# Patient Record
Sex: Female | Born: 1955 | Race: White | Hispanic: No | Marital: Married | Smoking: Never smoker
Health system: Southern US, Community
[De-identification: ages and names within clinical notes are randomized; demographics above are authoritative.]

## PROBLEM LIST (undated history)

## (undated) DIAGNOSIS — F419 Anxiety disorder, unspecified: Secondary | ICD-10-CM

## (undated) DIAGNOSIS — D649 Anemia, unspecified: Secondary | ICD-10-CM

## (undated) DIAGNOSIS — R112 Nausea with vomiting, unspecified: Secondary | ICD-10-CM

## (undated) DIAGNOSIS — R569 Unspecified convulsions: Secondary | ICD-10-CM

## (undated) DIAGNOSIS — M1990 Unspecified osteoarthritis, unspecified site: Secondary | ICD-10-CM

## (undated) DIAGNOSIS — Z9889 Other specified postprocedural states: Secondary | ICD-10-CM

---

## 1978-08-02 HISTORY — PX: OTHER SURGICAL HISTORY: SHX169

## 1999-03-21 ENCOUNTER — Emergency Department (HOSPITAL_COMMUNITY): Admission: EM | Admit: 1999-03-21 | Discharge: 1999-03-21 | Payer: Self-pay | Admitting: Emergency Medicine

## 1999-08-04 ENCOUNTER — Other Ambulatory Visit: Admission: RE | Admit: 1999-08-04 | Discharge: 1999-08-04 | Payer: Self-pay | Admitting: *Deleted

## 1999-08-27 ENCOUNTER — Ambulatory Visit (HOSPITAL_COMMUNITY): Admission: RE | Admit: 1999-08-27 | Discharge: 1999-08-27 | Payer: Self-pay | Admitting: *Deleted

## 2000-03-21 ENCOUNTER — Ambulatory Visit (HOSPITAL_COMMUNITY): Admission: RE | Admit: 2000-03-21 | Discharge: 2000-03-21 | Payer: Self-pay | Admitting: *Deleted

## 2000-03-21 ENCOUNTER — Encounter: Payer: Self-pay | Admitting: *Deleted

## 2000-03-31 ENCOUNTER — Ambulatory Visit (HOSPITAL_COMMUNITY): Admission: RE | Admit: 2000-03-31 | Discharge: 2000-03-31 | Payer: Self-pay | Admitting: *Deleted

## 2000-03-31 ENCOUNTER — Encounter: Payer: Self-pay | Admitting: *Deleted

## 2000-06-09 ENCOUNTER — Ambulatory Visit (HOSPITAL_COMMUNITY): Admission: RE | Admit: 2000-06-09 | Discharge: 2000-06-09 | Payer: Self-pay | Admitting: *Deleted

## 2000-06-09 ENCOUNTER — Encounter: Payer: Self-pay | Admitting: *Deleted

## 2001-02-21 ENCOUNTER — Other Ambulatory Visit: Admission: RE | Admit: 2001-02-21 | Discharge: 2001-02-21 | Payer: Self-pay | Admitting: Obstetrics and Gynecology

## 2002-01-23 ENCOUNTER — Ambulatory Visit: Admission: RE | Admit: 2002-01-23 | Discharge: 2002-01-23 | Payer: Self-pay | Admitting: Gastroenterology

## 2002-02-26 ENCOUNTER — Other Ambulatory Visit: Admission: RE | Admit: 2002-02-26 | Discharge: 2002-02-26 | Payer: Self-pay | Admitting: Obstetrics and Gynecology

## 2003-04-16 ENCOUNTER — Other Ambulatory Visit: Admission: RE | Admit: 2003-04-16 | Discharge: 2003-04-16 | Payer: Self-pay | Admitting: Obstetrics and Gynecology

## 2012-06-13 ENCOUNTER — Other Ambulatory Visit (HOSPITAL_COMMUNITY)
Admission: RE | Admit: 2012-06-13 | Discharge: 2012-06-13 | Disposition: A | Discharge status: Home / Self Care | Payer: Commercial Managed Care - PPO | Source: Ambulatory Visit | Attending: Family Medicine | Admitting: Family Medicine

## 2012-06-13 DIAGNOSIS — Z124 Encounter for screening for malignant neoplasm of cervix: Secondary | ICD-10-CM | POA: Insufficient documentation

## 2012-06-13 DIAGNOSIS — Z1151 Encounter for screening for human papillomavirus (HPV): Secondary | ICD-10-CM | POA: Insufficient documentation

## 2014-12-10 ENCOUNTER — Ambulatory Visit: Payer: Self-pay | Admitting: Orthopedic Surgery

## 2014-12-10 NOTE — Progress Notes (Signed)
Preoperative surgical orders have been place into the Epic hospital system for Rita Watson on 12/10/2014, 11:18 AM  by Patrica DuelPERKINS, ALEXZANDREW for surgery on 01-08-2015.  Preop Total Hip - Anterior Approach orders including IV Tylenol, and IV Decadron as long as there are no contraindications to the above medications. Rita Peacerew Perkins, PA-C

## 2015-01-02 NOTE — Progress Notes (Signed)
Pre Op Testing-12/31/14  Left message at [phone] to return my call 12/01/14 1000  Lm at above home number to call.  Five attempts  throughout the day to [phone] with phone number busy 01/02/15  ALL Correct phone numbers placed in epic

## 2015-01-03 NOTE — Patient Instructions (Addendum)
Michela Pitcheratricia K Reiley  01/03/2015   Your procedure is scheduled on:     01/08/2015    [facility] and follow signs to               Short Stay Center at      0845 AM.  Call this number if you have problems the morning of surgery [phone]   Remember: ONLY 1 PERSON MAY GO WITH YOU TO SHORT STAY TO GET  READY MORNING OF YOUR SURGERY.  Do not eat food or drink liquids :After Midnight.     Take these medicines the morning of surgery with A SIP OF WATER:    Dexilant, Prozac, Hydrocodone if needed                                You may not have any metal on your body including hair pins and              piercings  Do not wear jewelry, make-up, lotions, powders or perfumes, deodorant             Do not wear nail polish.  Do not shave  48 hours prior to surgery.                 Do not bring valuables to the hospital. [facility] IS NOT             RESPONSIBLE   FOR VALUABLES.  Contacts, dentures or bridgework may not be worn into surgery.  Leave suitcase in the car. After surgery it may be brought to your room.       Special Instructions: coughing and deep breathing exercises, leg exercises             Please read over the following fact sheets you were given: _____________________________________________________________________             [facility] - Preparing for Surgery Before surgery, you can play an important role.  Because skin is not sterile, your skin needs to be as free of germs as possible.  You can reduce the number of germs on your skin by washing with CHG (chlorahexidine gluconate) soap before surgery.  CHG is an antiseptic cleaner which kills germs and bonds with the skin to continue killing germs even after washing. Please DO NOT use if you have an allergy to CHG or antibacterial soaps.  If your skin becomes reddened/irritated stop using the CHG and inform your nurse when you arrive at Short Stay. Do not shave (including legs  and underarms) for at least 48 hours prior to the first CHG shower.  You may shave your face/neck. Please follow these instructions carefully:  1.  Shower with CHG Soap the night before surgery and the  morning of Surgery.  2.  If you choose to wash your hair, wash your hair first as usual with your  normal  shampoo.  3.  After you shampoo, rinse your hair and body thoroughly to remove the  shampoo.                           4.  Use CHG as you would any other liquid soap.  You can apply chg directly  to the skin and wash  Gently with a scrungie or clean washcloth.  5.  Apply the CHG Soap to your body ONLY FROM THE NECK DOWN.   Do not use on face/ open                           Wound or open sores. Avoid contact with eyes, ears mouth and genitals (private parts).                       Wash face,  Genitals (private parts) with your normal soap.             6.  Wash thoroughly, paying special attention to the area where your surgery  will be performed.  7.  Thoroughly rinse your body with warm water from the neck down.  8.  DO NOT shower/wash with your normal soap after using and rinsing off  the CHG Soap.                9.  Pat yourself dry with a clean towel.            10.  Wear clean pajamas.            11.  Place clean sheets on your bed the night of your first shower and do not  sleep with pets. Day of Surgery : Do not apply any lotions/deodorants the morning of surgery.  Please wear clean clothes to the hospital/surgery center.  FAILURE TO FOLLOW THESE INSTRUCTIONS MAY RESULT IN THE CANCELLATION OF YOUR SURGERY PATIENT SIGNATURE_________________________________  NURSE SIGNATURE__________________________________  ________________________________________________________________________  WHAT IS A BLOOD TRANSFUSION? Blood Transfusion Information  A transfusion is the replacement of blood or some of its parts. Blood is made up of multiple cells which provide different  functions.  Red blood cells carry oxygen and are used for blood loss replacement.  White blood cells fight against infection.  Platelets control bleeding.  Plasma helps clot blood.  Other blood products are available for specialized needs, such as hemophilia or other clotting disorders. BEFORE THE TRANSFUSION  Who gives blood for transfusions?   Healthy volunteers who are fully evaluated to make sure their blood is safe. This is blood bank blood. Transfusion therapy is the safest it has ever been in the practice of medicine. Before blood is taken from a donor, a complete history is taken to make sure that person has no history of diseases nor engages in risky social behavior (examples are intravenous drug use or sexual activity with multiple partners). The donor's travel history is screened to minimize risk of transmitting infections, such as malaria. The donated blood is tested for signs of infectious diseases, such as HIV and hepatitis. The blood is then tested to be sure it is compatible with you in order to minimize the chance of a transfusion reaction. If you or a relative donates blood, this is often done in anticipation of surgery and is not appropriate for emergency situations. It takes many days to process the donated blood. RISKS AND COMPLICATIONS Although transfusion therapy is very safe and saves many lives, the main dangers of transfusion include:  1. Getting an infectious disease. 2. Developing a transfusion reaction. This is an allergic reaction to something in the blood you were given. Every precaution is taken to prevent this. The decision to have a blood transfusion has been considered carefully by your caregiver before blood is given. Blood is not given unless the benefits outweigh  the risks. AFTER THE TRANSFUSION  Right after receiving a blood transfusion, you will usually feel much better and more energetic. This is especially true if your red blood cells have gotten low  (anemic). The transfusion raises the level of the red blood cells which carry oxygen, and this usually causes an energy increase.  The nurse administering the transfusion will monitor you carefully for complications. HOME CARE INSTRUCTIONS  No special instructions are needed after a transfusion. You may find your energy is better. Speak with your caregiver about any limitations on activity for underlying diseases you may have. SEEK MEDICAL CARE IF:   Your condition is not improving after your transfusion.  You develop redness or irritation at the intravenous (IV) site. SEEK IMMEDIATE MEDICAL CARE IF:  Any of the following symptoms occur over the next 12 hours:  Shaking chills.  You have a temperature by mouth above 102 F (38.9 C), not controlled by medicine.  Chest, back, or muscle pain.  People around you feel you are not acting correctly or are confused.  Shortness of breath or difficulty breathing.  Dizziness and fainting.  You get a rash or develop hives.  You have a decrease in urine output.  Your urine turns a dark color or changes to pink, red, or brown. Any of the following symptoms occur over the next 10 days:  You have a temperature by mouth above 102 F (38.9 C), not controlled by medicine.  Shortness of breath.  Weakness after normal activity.  The white part of the eye turns yellow (jaundice).  You have a decrease in the amount of urine or are urinating less often.  Your urine turns a dark color or changes to pink, red, or brown. Document Released: 07/16/2000 Document Revised: 10/11/2011 Document Reviewed: 03/04/2008 ExitCare Patient Information 2014 [place].  _______________________________________________________________________  Incentive Spirometer  An incentive spirometer is a tool that can help keep your lungs clear and active. This tool measures how well you are filling your lungs with each breath. Taking long deep breaths may help  reverse or decrease the chance of developing breathing (pulmonary) problems (especially infection) following:  A long period of time when you are unable to move or be active. BEFORE THE PROCEDURE   If the spirometer includes an indicator to show your best effort, your nurse or respiratory therapist will set it to a desired goal.  If possible, sit up straight or lean slightly forward. Try not to slouch.  Hold the incentive spirometer in an upright position. INSTRUCTIONS FOR USE  3. Sit on the edge of your bed if possible, or sit up as far as you can in bed or on a chair. 4. Hold the incentive spirometer in an upright position. 5. Breathe out normally. 6. Place the mouthpiece in your mouth and seal your lips tightly around it. 7. Breathe in slowly and as deeply as possible, raising the piston or the ball toward the top of the column. 8. Hold your breath for 3-5 seconds or for as long as possible. Allow the piston or ball to fall to the bottom of the column. 9. Remove the mouthpiece from your mouth and breathe out normally. 10. Rest for a few seconds and repeat Steps 1 through 7 at least 10 times every 1-2 hours when you are awake. Take your time and take a few normal breaths between deep breaths. 11. The spirometer may include an indicator to show your best effort. Use the indicator as a goal to work  toward during each repetition. 12. After each set of 10 deep breaths, practice coughing to be sure your lungs are clear. If you have an incision (the cut made at the time of surgery), support your incision when coughing by placing a pillow or rolled up towels firmly against it. Once you are able to get out of bed, walk around indoors and cough well. You may stop using the incentive spirometer when instructed by your caregiver.  RISKS AND COMPLICATIONS  Take your time so you do not get dizzy or light-headed.  If you are in pain, you may need to take or ask for pain medication before doing  incentive spirometry. It is harder to take a deep breath if you are having pain. AFTER USE  Rest and breathe slowly and easily.  It can be helpful to keep track of a log of your progress. Your caregiver can provide you with a simple table to help with this. If you are using the spirometer at home, follow these instructions: [place] IF:   You are having difficultly using the spirometer.  You have trouble using the spirometer as often as instructed.  Your pain medication is not giving enough relief while using the spirometer.  You develop fever of 100.5 F (38.1 C) or higher. SEEK IMMEDIATE MEDICAL CARE IF:   You cough up bloody sputum that had not been present before.  You develop fever of 102 F (38.9 C) or greater.  You develop worsening pain at or near the incision site. MAKE SURE YOU:   Understand these instructions.  Will watch your condition.  Will get help right away if you are not doing well or get worse. Document Released: 11/29/2006 Document Revised: 10/11/2011 Document Reviewed: 01/30/2007 [facility] Patient Information 2014 [place], [place].   ________________________________________________________________________

## 2015-01-06 ENCOUNTER — Encounter (HOSPITAL_COMMUNITY)
Admission: RE | Admit: 2015-01-06 | Discharge: 2015-01-06 | Disposition: A | Discharge status: Home / Self Care | Payer: BLUE CROSS/BLUE SHIELD | Source: Ambulatory Visit | Attending: Orthopedic Surgery | Admitting: Orthopedic Surgery

## 2015-01-06 ENCOUNTER — Encounter (HOSPITAL_COMMUNITY): Payer: Self-pay

## 2015-01-06 HISTORY — DX: Anemia, unspecified: D64.9

## 2015-01-06 HISTORY — DX: Other specified postprocedural states: Z98.890

## 2015-01-06 HISTORY — DX: Anxiety disorder, unspecified: F41.9

## 2015-01-06 HISTORY — DX: Unspecified osteoarthritis, unspecified site: M19.90

## 2015-01-06 HISTORY — DX: Unspecified convulsions: R56.9

## 2015-01-06 HISTORY — DX: Nausea with vomiting, unspecified: R11.2

## 2015-01-06 LAB — COMPREHENSIVE METABOLIC PANEL
ALT: 20 U/L (ref 14–54)
ANION GAP: 7 (ref 5–15)
AST: 25 U/L (ref 15–41)
Albumin: 3.8 g/dL (ref 3.5–5.0)
Alkaline Phosphatase: 70 U/L (ref 38–126)
BUN: 15 mg/dL (ref 6–20)
CALCIUM: 9 mg/dL (ref 8.9–10.3)
CO2: 26 mmol/L (ref 22–32)
Chloride: 106 mmol/L (ref 101–111)
Creatinine, Ser: 0.57 mg/dL (ref 0.44–1.00)
Glucose, Bld: 95 mg/dL (ref 65–99)
Potassium: 4.2 mmol/L (ref 3.5–5.1)
Sodium: 139 mmol/L (ref 135–145)
Total Bilirubin: 0.2 mg/dL — ABNORMAL LOW (ref 0.3–1.2)
Total Protein: 6.8 g/dL (ref 6.5–8.1)

## 2015-01-06 LAB — PROTIME-INR
INR: 1.13 (ref 0.00–1.49)
Prothrombin Time: 14.7 seconds (ref 11.6–15.2)

## 2015-01-06 LAB — URINALYSIS, ROUTINE W REFLEX MICROSCOPIC
BILIRUBIN URINE: NEGATIVE
GLUCOSE, UA: NEGATIVE mg/dL
Hgb urine dipstick: NEGATIVE
Ketones, ur: NEGATIVE mg/dL
LEUKOCYTES UA: NEGATIVE
Nitrite: NEGATIVE
PROTEIN: NEGATIVE mg/dL
Specific Gravity, Urine: 1.006 (ref 1.005–1.030)
Urobilinogen, UA: 0.2 mg/dL (ref 0.0–1.0)
pH: 6.5 (ref 5.0–8.0)

## 2015-01-06 LAB — CBC
HCT: 34.3 % — ABNORMAL LOW (ref 36.0–46.0)
Hemoglobin: 11 g/dL — ABNORMAL LOW (ref 12.0–15.0)
MCH: 25.5 pg — ABNORMAL LOW (ref 26.0–34.0)
MCHC: 32.1 g/dL (ref 30.0–36.0)
MCV: 79.6 fL (ref 78.0–100.0)
PLATELETS: 330 10*3/uL (ref 150–400)
RBC: 4.31 MIL/uL (ref 3.87–5.11)
RDW: 15.9 % — ABNORMAL HIGH (ref 11.5–15.5)
WBC: 8.6 10*3/uL (ref 4.0–10.5)

## 2015-01-06 LAB — SURGICAL PCR SCREEN
MRSA, PCR: NEGATIVE
STAPHYLOCOCCUS AUREUS: NEGATIVE

## 2015-01-06 LAB — APTT: aPTT: 32 seconds (ref 24–37)

## 2015-01-06 LAB — ABO/RH: ABO/RH(D): B NEG

## 2015-01-06 NOTE — Progress Notes (Signed)
Called office of Dr Loreta AveMann and requested LOV note and procedure note from 12/2014.  They will fax.

## 2015-01-06 NOTE — Progress Notes (Signed)
LOV note- 12/26/2014- Dr Charna ElizabethJyothi Mann on chart  And old office visit note from 12/2006  On chart  EGD done 01/03/2015 on chart along with colonoscopy done 01/03/2015 on chart

## 2015-01-06 NOTE — Progress Notes (Signed)
EKG- 12/03/2014 on chart  LOV- 12/03/14- DR Mila PalmerSharon Wolters on chart .

## 2015-01-07 ENCOUNTER — Ambulatory Visit: Payer: Self-pay | Admitting: Orthopedic Surgery

## 2015-01-07 NOTE — H&P (Signed)
Rita Watson DOB: 10/20/1955 Married / Language: English / Race: White Female Date of Admission:  01/08/2015 CC:  Left Hip Pain History of Present Illness The patient is a 59 year old female who comes in for a preoperative History and Physical. The patient is scheduled for a left total hip arthroplasty (anterior) to be performed by Dr. Gus Rankin. Aluisio, MD at [facility] on 01-08-2015. The patient is a 59 year old female who presents for their left hip pain and osteoarthritis. Symptoms reported today include: pain (that does seem to be getting progressively worse over time). Current treatment includes: NSAIDs (Aleve). She states the hip has gotten progressively worse since we last saw her four and half months ago. It is hurting at all times. It is limiting what she can and cannot do. She would like to be more active, but the hip is currently preventing her from doing so. She has lost more mobility since last visit. She even has pain at night at times. She is ready to proceed with surgery.  They have been treated conservatively in the past for the above stated problem and despite conservative measures, they continue to have progressive pain and severe functional limitations and dysfunction. They have failed non-operative management including home exercise, medications. It is felt that they would benefit from undergoing total joint replacement. Risks and benefits of the procedure have been discussed with the patient and they elect to proceed with surgery. There are no active contraindications to surgery such as ongoing infection or rapidly progressive neurological disease.  Problem List/Past Medical Primary osteoarthritis of left hip (M16.12) Osteoarthritis Seizure Disorder last episode about 25-30 years ago Ulcerative Colitis Menopause Allergic Rhinitis  Allergies Sulfa Drugs Nausea. GI upset  Family History Chronic Obstructive Lung Disease Paternal  Grandmother. Depression Mother. Heart Disease Father, Maternal Grandfather, Maternal Grandmother, Paternal Grandfather. Cancer Mother. Hypertension Brother, Father. Osteoarthritis Mother. Rheumatoid Arthritis Mother.  Social History Number of flights of stairs before winded 2-3 Tobacco use Never smoker. 04/30/2014 Tobacco / smoke exposure 04/30/2014: no No history of drug/alcohol rehab Not under pain contract Living situation live with spouse Exercise Exercises weekly; does running / walking Current work status working full time Marital status married Current drinker 04/30/2014: Currently drinks wine only occasionally per week Children 1  Medication History Aleve (Oral) Specific dose unknown - Active. Depakote (Oral) Specific dose unknown - Active. FLUoxetine HCl (  Capsule, Oral) Active. Multiple Vitamins (Oral) Active.  Past Surgical History Arthroscopy of Knee left Colon Polyp Removal - Colonoscopy Dilation and Curettage of Uterus Liver Laceration Repair Date: 1991  Review of Systems General Not Present- Chills, Fatigue, Fever, Memory Loss, Night Sweats, Weight Gain and Weight Loss. Skin Not Present- Eczema, Hives, Itching, Lesions and Rash. HEENT Not Present- Dentures, Double Vision, Headache, Hearing Loss, Tinnitus and Visual Loss. Respiratory Not Present- Allergies, Chronic Cough, Coughing up blood, Shortness of breath at rest and Shortness of breath with exertion. Cardiovascular Not Present- Chest Pain, Difficulty Breathing Lying Down, Murmur, Palpitations, Racing/skipping heartbeats and Swelling. Gastrointestinal Not Present- Abdominal Pain, Bloody Stool, Constipation, Diarrhea, Difficulty Swallowing, Heartburn, Jaundice, Loss of appetitie, Nausea and Vomiting. Female Genitourinary Not Present- Blood in Urine, Discharge, Flank Pain, Incontinence, Painful Urination, Urgency, Urinary frequency, Urinary Retention, Urinating at Night and Weak  urinary stream. Musculoskeletal Present- Joint Pain. Not Present- Back Pain, Joint Swelling, Morning Stiffness, Muscle Pain, Muscle Weakness and Spasms. Neurological Not Present- Blackout spells, Difficulty with balance, Dizziness, Paralysis, Tremor and Weakness. Psychiatric Not Present- Insomnia.  Vitals  Weight: 140 lb Height: 66in Weight was reported by patient. Height was reported by patient. Body Surface Area: 1.72 m Body Mass Index: 22.6 kg/m  BP: 122/72 (Sitting, Left Arm, Standard)  Physical Exam General Mental Status -Alert, cooperative and good historian. General Appearance-pleasant, Not in acute distress. Orientation-Oriented X3. Build & Nutrition-Well nourished and Well developed.  Head and Neck Head-normocephalic, atraumatic . Neck Global Assessment - supple, no bruit auscultated on the right, no bruit auscultated on the left.  Eye Pupil - Bilateral-Regular and Round. Motion - Bilateral-EOMI.  Chest and Lung Exam Auscultation Breath sounds - clear at anterior chest wall and clear at posterior chest wall. Adventitious sounds - No Adventitious sounds.  Cardiovascular Auscultation Rhythm - Regular rate and rhythm. Heart Sounds - S1 WNL and S2 WNL. Murmurs & Other Heart Sounds - Auscultation of the heart reveals - No Murmurs.  Abdomen Palpation/Percussion Tenderness - Abdomen is non-tender to palpation. Rigidity (guarding) - Abdomen is soft. Auscultation Auscultation of the abdomen reveals - Bowel sounds normal.  Female Genitourinary Note: Not done, not pertinent to present illness   Musculoskeletal Note: On exam, she is alert and oriented and in no apparent distress. Her left hip shows flexion to 90, no internal rotation, about 10 to 20 of external rotation, and 20 of abduction. The right hip has normal range of motion. Gait pattern is antalgic.  RADIOGRAPHS: AP pelvis and lateral of the hip show that she has severe bone on bone  arthritis of the hip with subchondral cystic formation and a large lateral osteophyte. 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  Assessment & Plan  Primary osteoarthritis of left hip (M16.12) Note:Surgical Plans: Left Total Hip Replacement - Anterior Approach  Disposition: Home  PCP: Dr. Mila PalmerSharon Wolters - Patient has been seen preoperatively and felt to be stable for surgery. She did want the patient "to see her GI/Dr. Loreta AveMann prior to surgery." "Please ask patient to call her GI/Dr. Loreta AveMann for a follow-up of possible GI loss. STOP NSAIDs,"  GI: Dr. Loreta AveMann - Patient has been seen preoperatively and felt to be stable for surgery.  IV TXA  Anesthesia Issues: History of Postop Nausea  Signed electronically by Lauraine RinneAlexzandrew L Oluwanifemi Susman, III PA-C

## 2015-01-08 ENCOUNTER — Encounter (HOSPITAL_COMMUNITY): Admission: RE | Payer: Self-pay | Source: Ambulatory Visit

## 2015-01-08 ENCOUNTER — Inpatient Hospital Stay (HOSPITAL_COMMUNITY): Payer: BLUE CROSS/BLUE SHIELD | Admitting: Anesthesiology

## 2015-01-08 ENCOUNTER — Inpatient Hospital Stay (HOSPITAL_COMMUNITY): Payer: BLUE CROSS/BLUE SHIELD

## 2015-01-08 ENCOUNTER — Inpatient Hospital Stay (HOSPITAL_COMMUNITY)
Admission: RE | Admit: 2015-01-08 | Discharge: 2015-01-09 | DRG: 470 | Disposition: A | Discharge status: Home Health | Payer: BLUE CROSS/BLUE SHIELD | Source: Ambulatory Visit | Attending: Orthopedic Surgery | Admitting: Orthopedic Surgery

## 2015-01-08 ENCOUNTER — Inpatient Hospital Stay (HOSPITAL_COMMUNITY)
Admission: RE | Admit: 2015-01-08 | Payer: Commercial Managed Care - PPO | Source: Ambulatory Visit | Admitting: Orthopedic Surgery

## 2015-01-08 ENCOUNTER — Encounter (HOSPITAL_COMMUNITY): Payer: Self-pay | Admitting: *Deleted

## 2015-01-08 ENCOUNTER — Encounter (HOSPITAL_COMMUNITY)
Admission: RE | Disposition: A | Discharge status: Home Health | Payer: Self-pay | Source: Ambulatory Visit | Attending: Orthopedic Surgery

## 2015-01-08 DIAGNOSIS — Z01812 Encounter for preprocedural laboratory examination: Secondary | ICD-10-CM | POA: Diagnosis not present

## 2015-01-08 DIAGNOSIS — M169 Osteoarthritis of hip, unspecified: Secondary | ICD-10-CM | POA: Diagnosis present

## 2015-01-08 DIAGNOSIS — Z79899 Other long term (current) drug therapy: Secondary | ICD-10-CM

## 2015-01-08 DIAGNOSIS — M25552 Pain in left hip: Secondary | ICD-10-CM | POA: Diagnosis present

## 2015-01-08 DIAGNOSIS — M1612 Unilateral primary osteoarthritis, left hip: Secondary | ICD-10-CM

## 2015-01-08 DIAGNOSIS — G40909 Epilepsy, unspecified, not intractable, without status epilepticus: Secondary | ICD-10-CM | POA: Diagnosis present

## 2015-01-08 DIAGNOSIS — Z96649 Presence of unspecified artificial hip joint: Secondary | ICD-10-CM

## 2015-01-08 HISTORY — PX: TOTAL HIP ARTHROPLASTY: SHX124

## 2015-01-08 LAB — TYPE AND SCREEN
ABO/RH(D): B NEG
Antibody Screen: NEGATIVE

## 2015-01-08 SURGERY — ARTHROPLASTY, HIP, TOTAL, ANTERIOR APPROACH
Anesthesia: Spinal | Site: Hip | Laterality: Left

## 2015-01-08 SURGERY — ARTHROPLASTY, HIP, TOTAL, ANTERIOR APPROACH
Anesthesia: Choice | Site: Hip | Laterality: Left

## 2015-01-08 MED ORDER — ONDANSETRON HCL 4 MG/2ML IJ SOLN
INTRAMUSCULAR | Status: AC
  Filled 2015-01-08: qty 2

## 2015-01-08 MED ORDER — PHENOL 1.4 % MT LIQD: 1.0000 | OROMUCOSAL | Status: DC | PRN

## 2015-01-08 MED ORDER — PANTOPRAZOLE SODIUM 40 MG PO TBEC
80.0000 mg | DELAYED_RELEASE_TABLET | Freq: Every day | ORAL | Status: DC
  Administered 2015-01-08 – 2015-01-09 (×2): 80 mg via ORAL
  Filled 2015-01-08 (×2): qty 2

## 2015-01-08 MED ORDER — PHENYLEPHRINE 40 MCG/ML (10ML) SYRINGE FOR IV PUSH (FOR BLOOD PRESSURE SUPPORT)
PREFILLED_SYRINGE | INTRAVENOUS | Status: AC
  Filled 2015-01-08: qty 10

## 2015-01-08 MED ORDER — CEFAZOLIN SODIUM-DEXTROSE 2-3 GM-% IV SOLR
INTRAVENOUS | Status: AC
Start: 2015-01-08 — End: 2015-01-08
  Filled 2015-01-08: qty 50

## 2015-01-08 MED ORDER — FLEET ENEMA 7-19 GM/118ML RE ENEM: 1.0000 | ENEMA | Freq: Once | RECTAL | Status: AC | PRN

## 2015-01-08 MED ORDER — PROPOFOL INFUSION 10 MG/ML OPTIME
INTRAVENOUS | Status: DC | PRN
  Administered 2015-01-08: 120 ug/kg/min via INTRAVENOUS

## 2015-01-08 MED ORDER — LACTATED RINGERS IV SOLN: INTRAVENOUS | Status: DC

## 2015-01-08 MED ORDER — DEXAMETHASONE SODIUM PHOSPHATE 10 MG/ML IJ SOLN
10.0000 mg | Freq: Once | INTRAMUSCULAR | Status: AC
  Administered 2015-01-08: 10 mg via INTRAVENOUS

## 2015-01-08 MED ORDER — METOCLOPRAMIDE HCL 5 MG/ML IJ SOLN
5.0000 mg | Freq: Three times a day (TID) | INTRAMUSCULAR | Status: DC | PRN
Start: 2015-01-08 — End: 2015-01-09

## 2015-01-08 MED ORDER — ONDANSETRON HCL 4 MG/2ML IJ SOLN: 4.0000 mg | Freq: Four times a day (QID) | INTRAMUSCULAR | Status: DC | PRN

## 2015-01-08 MED ORDER — FENTANYL CITRATE (PF) 100 MCG/2ML IJ SOLN
INTRAMUSCULAR | Status: AC
  Filled 2015-01-08: qty 2

## 2015-01-08 MED ORDER — METHOCARBAMOL 500 MG PO TABS
500.0000 mg | ORAL_TABLET | Freq: Four times a day (QID) | ORAL | Status: DC | PRN
  Administered 2015-01-08 – 2015-01-09 (×2): 500 mg via ORAL
  Filled 2015-01-08 (×2): qty 1

## 2015-01-08 MED ORDER — DIPHENHYDRAMINE HCL 12.5 MG/5ML PO ELIX
12.5000 mg | ORAL_SOLUTION | ORAL | Status: DC | PRN
Start: 2015-01-08 — End: 2015-01-09

## 2015-01-08 MED ORDER — ONDANSETRON HCL 4 MG PO TABS: 4.0000 mg | ORAL_TABLET | Freq: Four times a day (QID) | ORAL | Status: DC | PRN

## 2015-01-08 MED ORDER — ACETAMINOPHEN 325 MG PO TABS
650.0000 mg | ORAL_TABLET | Freq: Four times a day (QID) | ORAL | Status: DC | PRN
Start: 2015-01-09 — End: 2015-01-09

## 2015-01-08 MED ORDER — OXYCODONE HCL 5 MG PO TABS
5.0000 mg | ORAL_TABLET | ORAL | Status: DC | PRN
  Administered 2015-01-08 – 2015-01-09 (×8): 10 mg via ORAL
  Filled 2015-01-08 (×8): qty 2

## 2015-01-08 MED ORDER — CEFAZOLIN SODIUM-DEXTROSE 2-3 GM-% IV SOLR
2.0000 g | Freq: Four times a day (QID) | INTRAVENOUS | Status: AC
  Administered 2015-01-08 (×2): 2 g via INTRAVENOUS
  Filled 2015-01-08 (×2): qty 50

## 2015-01-08 MED ORDER — TRANEXAMIC ACID 1000 MG/10ML IV SOLN
1000.0000 mg | INTRAVENOUS | Status: AC
  Administered 2015-01-08: 1000 mg via INTRAVENOUS
  Filled 2015-01-08: qty 10

## 2015-01-08 MED ORDER — CHLORHEXIDINE GLUCONATE 4 % EX LIQD
60.0000 mL | Freq: Once | CUTANEOUS | Status: DC
Start: 2015-01-08 — End: 2015-01-08

## 2015-01-08 MED ORDER — FLUOXETINE HCL 20 MG PO CAPS
20.0000 mg | ORAL_CAPSULE | Freq: Every morning | ORAL | Status: DC
  Administered 2015-01-09: 20 mg via ORAL
  Filled 2015-01-08 (×2): qty 1

## 2015-01-08 MED ORDER — MIDAZOLAM HCL 5 MG/5ML IJ SOLN
INTRAMUSCULAR | Status: DC | PRN
  Administered 2015-01-08 (×2): 1 mg via INTRAVENOUS

## 2015-01-08 MED ORDER — BISACODYL 10 MG RE SUPP: 10.0000 mg | Freq: Every day | RECTAL | Status: DC | PRN

## 2015-01-08 MED ORDER — CEFAZOLIN SODIUM-DEXTROSE 2-3 GM-% IV SOLR
2.0000 g | INTRAVENOUS | Status: AC
  Administered 2015-01-08: 2 g via INTRAVENOUS

## 2015-01-08 MED ORDER — SODIUM CHLORIDE 0.9 % IV SOLN
INTRAVENOUS | Status: DC
  Administered 2015-01-08: 18:00:00 via INTRAVENOUS

## 2015-01-08 MED ORDER — PHENYLEPHRINE HCL 10 MG/ML IJ SOLN
INTRAMUSCULAR | Status: DC | PRN
  Administered 2015-01-08 (×5): 80 ug via INTRAVENOUS

## 2015-01-08 MED ORDER — PROPOFOL 10 MG/ML IV BOLUS
INTRAVENOUS | Status: AC
  Filled 2015-01-08: qty 20

## 2015-01-08 MED ORDER — ACETAMINOPHEN 500 MG PO TABS
1000.0000 mg | ORAL_TABLET | Freq: Four times a day (QID) | ORAL | Status: AC
  Administered 2015-01-08 – 2015-01-09 (×4): 1000 mg via ORAL
  Filled 2015-01-08 (×5): qty 2

## 2015-01-08 MED ORDER — HYDROMORPHONE HCL 1 MG/ML IJ SOLN: 0.2500 mg | INTRAMUSCULAR | Status: DC | PRN

## 2015-01-08 MED ORDER — ACETAMINOPHEN 650 MG RE SUPP: 650.0000 mg | Freq: Four times a day (QID) | RECTAL | Status: DC | PRN

## 2015-01-08 MED ORDER — LACTATED RINGERS IV SOLN
INTRAVENOUS | Status: DC | PRN
  Administered 2015-01-08 (×3): via INTRAVENOUS

## 2015-01-08 MED ORDER — BUPIVACAINE HCL (PF) 0.25 % IJ SOLN
INTRAMUSCULAR | Status: AC
Start: 2015-01-08 — End: 2015-01-08
  Filled 2015-01-08: qty 30

## 2015-01-08 MED ORDER — DOCUSATE SODIUM 100 MG PO CAPS
100.0000 mg | ORAL_CAPSULE | Freq: Two times a day (BID) | ORAL | Status: DC
  Administered 2015-01-08 – 2015-01-09 (×2): 100 mg via ORAL

## 2015-01-08 MED ORDER — ACETAMINOPHEN 10 MG/ML IV SOLN
INTRAVENOUS | Status: AC
  Filled 2015-01-08: qty 100

## 2015-01-08 MED ORDER — ACETAMINOPHEN 10 MG/ML IV SOLN
1000.0000 mg | Freq: Once | INTRAVENOUS | Status: AC
  Administered 2015-01-08: 1000 mg via INTRAVENOUS
  Filled 2015-01-08: qty 100

## 2015-01-08 MED ORDER — SODIUM CHLORIDE 0.9 % IV SOLN: INTRAVENOUS | Status: DC

## 2015-01-08 MED ORDER — RIVAROXABAN 10 MG PO TABS
10.0000 mg | ORAL_TABLET | Freq: Every day | ORAL | Status: DC
  Administered 2015-01-09: 10 mg via ORAL
  Filled 2015-01-08 (×2): qty 1

## 2015-01-08 MED ORDER — METOCLOPRAMIDE HCL 10 MG PO TABS: 5.0000 mg | ORAL_TABLET | Freq: Three times a day (TID) | ORAL | Status: DC | PRN

## 2015-01-08 MED ORDER — DEXAMETHASONE SODIUM PHOSPHATE 10 MG/ML IJ SOLN
10.0000 mg | Freq: Once | INTRAMUSCULAR | Status: AC
  Administered 2015-01-09: 10 mg via INTRAVENOUS
  Filled 2015-01-08: qty 1

## 2015-01-08 MED ORDER — METHOCARBAMOL 1000 MG/10ML IJ SOLN
500.0000 mg | Freq: Four times a day (QID) | INTRAMUSCULAR | Status: DC | PRN
  Administered 2015-01-08: 500 mg via INTRAVENOUS
  Filled 2015-01-08 (×2): qty 5

## 2015-01-08 MED ORDER — FENTANYL CITRATE (PF) 250 MCG/5ML IJ SOLN
INTRAMUSCULAR | Status: DC | PRN
  Administered 2015-01-08 (×2): 50 ug via INTRAVENOUS

## 2015-01-08 MED ORDER — SUCRALFATE 1 GM/10ML PO SUSP
1.0000 g | Freq: Three times a day (TID) | ORAL | Status: DC
  Administered 2015-01-08 – 2015-01-09 (×4): 1 g via ORAL
  Filled 2015-01-08 (×7): qty 10

## 2015-01-08 MED ORDER — MENTHOL 3 MG MT LOZG: 1.0000 | LOZENGE | OROMUCOSAL | Status: DC | PRN

## 2015-01-08 MED ORDER — DIVALPROEX SODIUM 125 MG PO DR TAB
125.0000 mg | DELAYED_RELEASE_TABLET | Freq: Every day | ORAL | Status: DC
  Administered 2015-01-08: 125 mg via ORAL
  Filled 2015-01-08 (×2): qty 1

## 2015-01-08 MED ORDER — LIDOCAINE HCL 1 % IJ SOLN
INTRAMUSCULAR | Status: AC
Start: 2015-01-08 — End: 2015-01-08
  Filled 2015-01-08: qty 20

## 2015-01-08 MED ORDER — ONDANSETRON HCL 4 MG/2ML IJ SOLN
INTRAMUSCULAR | Status: DC | PRN
  Administered 2015-01-08: 4 mg via INTRAVENOUS

## 2015-01-08 MED ORDER — MIDAZOLAM HCL 2 MG/2ML IJ SOLN
INTRAMUSCULAR | Status: AC
  Filled 2015-01-08: qty 2

## 2015-01-08 MED ORDER — BUPIVACAINE HCL (PF) 0.25 % IJ SOLN
INTRAMUSCULAR | Status: DC | PRN
  Administered 2015-01-08: 30 mL

## 2015-01-08 MED ORDER — DEXAMETHASONE SODIUM PHOSPHATE 10 MG/ML IJ SOLN
INTRAMUSCULAR | Status: AC
  Filled 2015-01-08: qty 1

## 2015-01-08 MED ORDER — KETOROLAC TROMETHAMINE 15 MG/ML IJ SOLN: 7.5000 mg | Freq: Four times a day (QID) | INTRAMUSCULAR | Status: AC | PRN

## 2015-01-08 MED ORDER — PANTOPRAZOLE SODIUM 40 MG PO TBEC: 80.0000 mg | DELAYED_RELEASE_TABLET | Freq: Every day | ORAL | Status: DC

## 2015-01-08 MED ORDER — MORPHINE SULFATE 2 MG/ML IJ SOLN
1.0000 mg | INTRAMUSCULAR | Status: DC | PRN
  Administered 2015-01-08: 1 mg via INTRAVENOUS
  Filled 2015-01-08: qty 1

## 2015-01-08 MED ORDER — POLYETHYLENE GLYCOL 3350 17 G PO PACK: 17.0000 g | PACK | Freq: Every day | ORAL | Status: DC | PRN

## 2015-01-08 MED ORDER — TRAMADOL HCL 50 MG PO TABS
50.0000 mg | ORAL_TABLET | Freq: Four times a day (QID) | ORAL | Status: DC | PRN
  Administered 2015-01-08: 100 mg via ORAL
  Filled 2015-01-08: qty 2

## 2015-01-08 SURGICAL SUPPLY — 41 items
BAG DECANTER FOR FLEXI CONT (MISCELLANEOUS) ×3 IMPLANT
BAG ZIPLOCK 12X15 (MISCELLANEOUS) IMPLANT
BLADE EXTENDED COATED 6.5IN (ELECTRODE) ×3 IMPLANT
BLADE SAG 18X100X1.27 (BLADE) ×3 IMPLANT
CAPT HIP TOTAL 2 ×3 IMPLANT
CLOSURE WOUND 1/2 X4 (GAUZE/BANDAGES/DRESSINGS) ×1
COVER PERINEAL POST (MISCELLANEOUS) ×3 IMPLANT
DECANTER SPIKE VIAL GLASS SM (MISCELLANEOUS) ×3 IMPLANT
DRAPE C-ARM 42X120 X-RAY (DRAPES) ×3 IMPLANT
DRAPE STERI IOBAN 125X83 (DRAPES) ×3 IMPLANT
DRAPE U-SHAPE 47X51 STRL (DRAPES) ×9 IMPLANT
DRSG ADAPTIC 3X8 NADH LF (GAUZE/BANDAGES/DRESSINGS) ×3 IMPLANT
DRSG MEPILEX BORDER 4X4 (GAUZE/BANDAGES/DRESSINGS) ×3 IMPLANT
DRSG MEPILEX BORDER 4X8 (GAUZE/BANDAGES/DRESSINGS) ×3 IMPLANT
DURAPREP 26ML APPLICATOR (WOUND CARE) ×3 IMPLANT
ELECT REM PT RETURN 9FT ADLT (ELECTROSURGICAL) ×3
ELECTRODE REM PT RTRN 9FT ADLT (ELECTROSURGICAL) ×1 IMPLANT
EVACUATOR 1/8 PVC DRAIN (DRAIN) ×3 IMPLANT
FACESHIELD WRAPAROUND (MASK) ×12 IMPLANT
GLOVE BIO SURGEON STRL SZ7.5 (GLOVE) ×3 IMPLANT
GLOVE BIO SURGEON STRL SZ8 (GLOVE) ×6 IMPLANT
GLOVE BIOGEL PI IND STRL 8 (GLOVE) ×2 IMPLANT
GLOVE BIOGEL PI INDICATOR 8 (GLOVE) ×4
GOWN STRL REUS W/TWL LRG LVL3 (GOWN DISPOSABLE) ×3 IMPLANT
GOWN STRL REUS W/TWL XL LVL3 (GOWN DISPOSABLE) ×3 IMPLANT
KIT BASIN OR (CUSTOM PROCEDURE TRAY) ×3 IMPLANT
NDL SAFETY ECLIPSE 18X1.5 (NEEDLE) ×1 IMPLANT
NEEDLE HYPO 18GX1.5 SHARP (NEEDLE) ×2
PACK TOTAL JOINT (CUSTOM PROCEDURE TRAY) ×3 IMPLANT
PEN SKIN MARKING BROAD (MISCELLANEOUS) ×3 IMPLANT
STRIP CLOSURE SKIN 1/2X4 (GAUZE/BANDAGES/DRESSINGS) ×2 IMPLANT
SUT ETHIBOND NAB CT1 #1 30IN (SUTURE) ×3 IMPLANT
SUT MNCRL AB 4-0 PS2 18 (SUTURE) ×3 IMPLANT
SUT VIC AB 2-0 CT1 27 (SUTURE) ×4
SUT VIC AB 2-0 CT1 TAPERPNT 27 (SUTURE) ×2 IMPLANT
SUT VLOC 180 0 24IN GS25 (SUTURE) ×3 IMPLANT
SYR 30ML LL (SYRINGE) ×3 IMPLANT
SYR 50ML LL SCALE MARK (SYRINGE) IMPLANT
TOWEL OR 17X26 10 PK STRL BLUE (TOWEL DISPOSABLE) ×3 IMPLANT
TOWEL OR NON WOVEN STRL DISP B (DISPOSABLE) ×3 IMPLANT
TRAY FOLEY W/METER SILVER 14FR (SET/KITS/TRAYS/PACK) ×3 IMPLANT

## 2015-01-08 NOTE — Op Note (Signed)
OPERATIVE REPORT  PREOPERATIVE DIAGNOSIS: Osteoarthritis of the Left hip.   POSTOPERATIVE DIAGNOSIS: Osteoarthritis of the Left  hip.   PROCEDURE: Left total hip arthroplasty, anterior approach.   SURGEON: Ollen GrossFrank Kierah Goatley, MD   ASSISTANT: Avel Peacerew Perkins, PA-C  ANESTHESIA:  Spinal  ESTIMATED BLOOD LOSS:- 550 ml  DRAINS: Hemovac x1.   COMPLICATIONS: None   CONDITION: PACU - hemodynamically stable.   BRIEF CLINICAL NOTE: Rita Watson is a 59 y.o. female who has advanced end-  stage arthritis of her Left  hip with progressively worsening pain and  dysfunction.The patient has failed nonoperative management and presents for  total hip arthroplasty.   PROCEDURE IN DETAIL: After successful administration of spinal  anesthetic, the traction boots [facility] were placed on both  feet and the [facility], boots placed into the leg  holders. The Left hip was then isolated from the perineum with plastic  drapes and prepped and draped in the usual sterile fashion. ASIS and  greater trochanter were marked and a oblique incision was made, starting  at about 1 cm lateral and 2 cm distal to the ASIS and coursing towards  the anterior cortex of the femur. The skin was cut with a 10 blade  through subcutaneous tissue to the level of the fascia overlying the  tensor fascia lata muscle. The fascia was then incised in line with the  incision at the junction of the anterior third and posterior 2/3rd. The  muscle was teased off the fascia and then the interval between the TFL  and the rectus was developed. The Hohmann retractor was then placed at  the top of the femoral neck over the capsule. The vessels overlying the  capsule were cauterized and the fat on top of the capsule was removed.  A Hohmann retractor was then placed anterior underneath the rectus  femoris to give exposure to the entire anterior capsule. A T-shaped  capsulotomy was performed. The  edges were tagged and the femoral head  was identified.       Osteophytes are removed off the superior acetabulum.  The femoral neck was then cut in situ with an oscillating saw. Traction  was then applied to the left lower extremity utilizing the [facility]  traction. The femoral head was then removed. Retractors were placed  around the acetabulum and then circumferential removal of the labrum was  performed. Osteophytes were also removed. Reaming starts at 43 mm to  medialize and  Increased in 2 mm increments to 49 mm. We reamed in  approximately 40 degrees of abduction, 20 degrees anteversion. A 50 mm  pinnacle acetabular shell was then impacted in anatomic position under  fluoroscopic guidance with excellent purchase. We did not need to place  any additional dome screws. A 32 mm neutral + 4 marathon liner was then  placed into the acetabular shell.       The femoral lift was then placed along the lateral aspect of the femur  just distal to the vastus ridge. The leg was  externally rotated and capsule  was stripped off the inferior aspect of the femoral neck down to the  level of the lesser trochanter, this was done with electrocautery. The femur was lifted after this was performed. The  leg was then placed and extended in adducted position to essentially delivering the femur. We also removed the capsule superiorly and the  piriformis from the piriformis  fossa to gain excellent exposure of the  proximal femur. Rongeur was used to remove some cancellous bone to get  into the lateral portion of the proximal femur for placement of the  initial starter reamer. The starter broaches was placed  the starter broach  and was shown to go down the center of the canal. Broaching  with the  Corail system was then performed starting at size 8, coursing  Up to size 11. A size 11 had excellent torsional and rotational  and axial stability. The trial standard offset neck was then placed  with a 32 + 5 trial  head. The hip was then reduced. We confirmed that  the stem was in the canal both on AP and lateral x-rays. It also has excellent sizing. The hip was reduced with outstanding stability through full extension, full external rotation,  and then flexion in adduction internal rotation. AP pelvis was taken  and the leg lengths were measured and found to be exactly equal. Hip  was then dislocated again and the femoral head and neck removed. The  femoral broach was removed. Size 11 Corail stem with a standard offset  neck was then impacted into the femur following native anteversion. Has  excellent purchase in the canal. Excellent torsional and rotational and  axial stability. It is confirmed to be in the canal on AP and lateral  fluoroscopic views. The 32 + 5 ceramic head was placed and the hip  reduced with outstanding stability. Again AP pelvis was taken and it  confirmed that the leg lengths were equal. The wound was then copiously  irrigated with saline solution and the capsule reattached and repaired  with Ethibond suture. 30 ml of .25% Bupivicaine injected into the capsule and into the edge of the tensor fascia lata as well as subcutaneous tissue. The fascia overlying the tensor fascia lata was  then closed with a running #1 V-Loc. Subcu was closed with interrupted  2-0 Vicryl and subcuticular running 4-0 Monocryl. Incision was cleaned  and dried. Steri-Strips and a bulky sterile dressing applied. Hemovac  drain was hooked to suction and then he was awakened and transported to  recovery in stable condition.        Please note that a surgical assistant was a medical necessity for this procedure to perform it in a safe and expeditious manner. Assistant was necessary to provide appropriate retraction of vital neurovascular structures and to prevent femoral fracture and allow for anatomic placement of the prosthesis.  Gaynelle Arabian, M.D.

## 2015-01-08 NOTE — Anesthesia Preprocedure Evaluation (Signed)
Anesthesia Evaluation  Patient identified by MRN, date of birth, ID band Patient awake    Reviewed: Allergy & Precautions, H&P , NPO status , Patient's Chart, lab work & pertinent test results  History of Anesthesia Complications (+) PONV  Airway Mallampati: II  TM Distance: >3 FB Neck ROM: full    Dental no notable dental hx.    Pulmonary neg pulmonary ROS,  breath sounds clear to auscultation  Pulmonary exam normal       Cardiovascular Exercise Tolerance: Good negative cardio ROS Normal cardiovascular examRhythm:regular Rate:Normal     Neuro/Psych Seizures -,  negative neurological ROS  negative psych ROS   GI/Hepatic negative GI ROS, Neg liver ROS,   Endo/Other  negative endocrine ROS  Renal/GU negative Renal ROS  negative genitourinary   Musculoskeletal   Abdominal   Peds  Hematology negative hematology ROS (+)   Anesthesia Other Findings   Reproductive/Obstetrics negative OB ROS                             Anesthesia Physical Anesthesia Plan  ASA: II  Anesthesia Plan: Spinal   Post-op Pain Management:    Induction:   Airway Management Planned:   Additional Equipment:   Intra-op Plan:   Post-operative Plan:   Informed Consent: I have reviewed the patients History and Physical, chart, labs and discussed the procedure including the risks, benefits and alternatives for the proposed anesthesia with the patient or authorized representative who has indicated his/her understanding and acceptance.   Dental Advisory Given  Plan Discussed with: CRNA and Surgeon  Anesthesia Plan Comments:         Anesthesia Quick Evaluation

## 2015-01-08 NOTE — Anesthesia Postprocedure Evaluation (Signed)
  Anesthesia Post-op Note  Patient: Rita Watson  Procedure(s) Performed: Procedure(s) (LRB): LEFT TOTAL HIP ARTHROPLASTY ANTERIOR APPROACH (Left)  Patient Location: PACU  Anesthesia Type: Spinal  Level of Consciousness: awake and alert   Airway and Oxygen Therapy: Patient Spontanous Breathing  Post-op Pain: mild  Post-op Assessment: Post-op Vital signs reviewed, Patient's Cardiovascular Status Stable, Respiratory Function Stable, Patent Airway and No signs of Nausea or vomiting  Last Vitals:  Filed Vitals:   01/08/15 1500  BP: 108/65  Pulse: 72  Temp: 36.7 C  Resp: 15    Post-op Vital Signs: stable   Complications: No apparent anesthesia complications

## 2015-01-08 NOTE — H&P (View-Only) (Signed)
Rita Watson DOB: 10/21/1955 Married / Language: English / Race: White Female Date of Admission:  01/08/2015 CC:  Left Hip Pain History of Present Illness The patient is a 58 year old female who comes in for a preoperative History and Physical. The patient is scheduled for a left total hip arthroplasty (anterior) to be performed by Dr. Frank V. Aluisio, MD at [facility] on 01-08-2015. The patient is a 58 year old female who presents for their left hip pain and osteoarthritis. Symptoms reported today include: pain (that does seem to be getting progressively worse over time). Current treatment includes: NSAIDs (Aleve). She states the hip has gotten progressively worse since we last saw her four and half months ago. It is hurting at all times. It is limiting what she can and cannot do. She would like to be more active, but the hip is currently preventing her from doing so. She has lost more mobility since last visit. She even has pain at night at times. She is ready to proceed with surgery.  They have been treated conservatively in the past for the above stated problem and despite conservative measures, they continue to have progressive pain and severe functional limitations and dysfunction. They have failed non-operative management including home exercise, medications. It is felt that they would benefit from undergoing total joint replacement. Risks and benefits of the procedure have been discussed with the patient and they elect to proceed with surgery. There are no active contraindications to surgery such as ongoing infection or rapidly progressive neurological disease.  Problem List/Past Medical Primary osteoarthritis of left hip (M16.12) Osteoarthritis Seizure Disorder last episode about 25-30 years ago Ulcerative Colitis Menopause Allergic Rhinitis  Allergies Sulfa Drugs Nausea. GI upset  Family History Chronic Obstructive Lung Disease Paternal  Grandmother. Depression Mother. Heart Disease Father, Maternal Grandfather, Maternal Grandmother, Paternal Grandfather. Cancer Mother. Hypertension Brother, Father. Osteoarthritis Mother. Rheumatoid Arthritis Mother.  Social History Number of flights of stairs before winded 2-3 Tobacco use Never smoker. 04/30/2014 Tobacco / smoke exposure 04/30/2014: no No history of drug/alcohol rehab Not under pain contract Living situation live with spouse Exercise Exercises weekly; does running / walking Current work status working full time Marital status married Current drinker 04/30/2014: Currently drinks wine only occasionally per week Children 1  Medication History Aleve (Oral) Specific dose unknown - Active. Depakote (Oral) Specific dose unknown - Active. FLUoxetine HCl (10MG Capsule, Oral) Active. Multiple Vitamins (Oral) Active.  Past Surgical History Arthroscopy of Knee left Colon Polyp Removal - Colonoscopy Dilation and Curettage of Uterus Liver Laceration Repair Date: 1991  Review of Systems General Not Present- Chills, Fatigue, Fever, Memory Loss, Night Sweats, Weight Gain and Weight Loss. Skin Not Present- Eczema, Hives, Itching, Lesions and Rash. HEENT Not Present- Dentures, Double Vision, Headache, Hearing Loss, Tinnitus and Visual Loss. Respiratory Not Present- Allergies, Chronic Cough, Coughing up blood, Shortness of breath at rest and Shortness of breath with exertion. Cardiovascular Not Present- Chest Pain, Difficulty Breathing Lying Down, Murmur, Palpitations, Racing/skipping heartbeats and Swelling. Gastrointestinal Not Present- Abdominal Pain, Bloody Stool, Constipation, Diarrhea, Difficulty Swallowing, Heartburn, Jaundice, Loss of appetitie, Nausea and Vomiting. Female Genitourinary Not Present- Blood in Urine, Discharge, Flank Pain, Incontinence, Painful Urination, Urgency, Urinary frequency, Urinary Retention, Urinating at Night and Weak  urinary stream. Musculoskeletal Present- Joint Pain. Not Present- Back Pain, Joint Swelling, Morning Stiffness, Muscle Pain, Muscle Weakness and Spasms. Neurological Not Present- Blackout spells, Difficulty with balance, Dizziness, Paralysis, Tremor and Weakness. Psychiatric Not Present- Insomnia.  Vitals    Weight: 140 lb Height: 66in Weight was reported by patient. Height was reported by patient. Body Surface Area: 1.72 m Body Mass Index: 22.6 kg/m  BP: 122/72 (Sitting, Left Arm, Standard)  Physical Exam General Mental Status -Alert, cooperative and good historian. General Appearance-pleasant, Not in acute distress. Orientation-Oriented X3. Build & Nutrition-Well nourished and Well developed.  Head and Neck Head-normocephalic, atraumatic . Neck Global Assessment - supple, no bruit auscultated on the right, no bruit auscultated on the left.  Eye Pupil - Bilateral-Regular and Round. Motion - Bilateral-EOMI.  Chest and Lung Exam Auscultation Breath sounds - clear at anterior chest wall and clear at posterior chest wall. Adventitious sounds - No Adventitious sounds.  Cardiovascular Auscultation Rhythm - Regular rate and rhythm. Heart Sounds - S1 WNL and S2 WNL. Murmurs & Other Heart Sounds - Auscultation of the heart reveals - No Murmurs.  Abdomen Palpation/Percussion Tenderness - Abdomen is non-tender to palpation. Rigidity (guarding) - Abdomen is soft. Auscultation Auscultation of the abdomen reveals - Bowel sounds normal.  Female Genitourinary Note: Not done, not pertinent to present illness   Musculoskeletal Note: On exam, she is alert and oriented and in no apparent distress. Her left hip shows flexion to 90, no internal rotation, about 10 to 20 of external rotation, and 20 of abduction. The right hip has normal range of motion. Gait pattern is antalgic.  RADIOGRAPHS: AP pelvis and lateral of the hip show that she has severe bone on bone  arthritis of the hip with subchondral cystic formation and a large lateral osteophyte. 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  Assessment & Plan  Primary osteoarthritis of left hip (M16.12) Note:Surgical Plans: Left Total Hip Replacement - Anterior Approach  Disposition: Home  PCP: Dr. Sharon Wolters - Patient has been seen preoperatively and felt to be stable for surgery. She did want the patient "to see her GI/Dr. Mann prior to surgery." "Please ask patient to call her GI/Dr. Mann for a follow-up of possible GI loss. STOP NSAIDs,"  GI: Dr. Mann - Patient has been seen preoperatively and felt to be stable for surgery.  IV TXA  Anesthesia Issues: History of Postop Nausea  Signed electronically by Ardra Kuznicki L Harold Moncus, III PA-C 

## 2015-01-08 NOTE — Evaluation (Signed)
Physical Therapy Evaluation Patient Details Name: Rita Watson MRN: [id number] DOB: 07/14/1956 Today's Date: 01/08/2015   History of Present Illness  L THR  Clinical Impression  Pt s/p L THR presents with decreased L LE strength/ROM and post op pain limiting functional mobility.  Pt should progress well to dc home with family assist and HHPT follow up.    Follow Up Recommendations Home health PT    Equipment Recommendations  Rolling walker with 5" wheels    Recommendations for Other Services OT consult     Precautions / Restrictions Precautions Precautions: Fall Restrictions Weight Bearing Restrictions: No Other Position/Activity Restrictions: WBAT      Mobility  Bed Mobility Overal bed mobility: Needs Assistance Bed Mobility: Supine to Sit     Supine to sit: Min assist     General bed mobility comments: cues for sequence and use of R LE to self assist  Transfers Overall transfer level: Needs assistance Equipment used: Rolling walker (2 wheeled) Transfers: Sit to/from Stand Sit to Stand: Min assist;From elevated surface         General transfer comment: cues for LE management and use of UEs to self assist  Ambulation/Gait Ambulation/Gait assistance: Min assist Ambulation Distance (Feet): 65 Feet Assistive device: Rolling walker (2 wheeled) Gait Pattern/deviations: Step-to pattern;Decreased step length - right;Decreased step length - left;Shuffle;Trunk flexed Gait velocity: decr   General Gait Details: cues for posture, position from RW and initial sequence  Stairs            Wheelchair Mobility    Modified Rankin (Stroke Patients Only)       Balance                                             Pertinent Vitals/Pain Pain Assessment: 0-10 Pain Score: 5  Pain Location: L hip Pain Descriptors / Indicators: Aching;Sore Pain Intervention(s): Limited activity within patient's tolerance;Monitored during  session;Premedicated before session;Ice applied    Home Living Family/patient expects to be discharged to:: Private residence Living Arrangements: Spouse/significant other Available Help at Discharge: Family Type of Home: House Home Access: Stairs to enter Entrance Stairs-Rails: None Entrance Stairs-Number of Steps: 3 Home Layout: One level Home Equipment: Cane - single point      Prior Function Level of Independence: Independent;Independent with assistive device(s)         Comments: Using cane prior to admit     Hand Dominance   Dominant Hand: Right    Extremity/Trunk Assessment   Upper Extremity Assessment: Overall WFL for tasks assessed           Lower Extremity Assessment: LLE deficits/detail      Cervical / Trunk Assessment: Normal  Communication   Communication: No difficulties  Cognition Arousal/Alertness: Awake/alert Behavior During Therapy: WFL for tasks assessed/performed Overall Cognitive Status: Within Functional Limits for tasks assessed                      General Comments      Exercises Total Joint Exercises Ankle Circles/Pumps: AROM;Both;15 reps;Supine      Assessment/Plan    PT Assessment Patient needs continued PT services  PT Diagnosis Difficulty walking   PT Problem List Decreased strength;Decreased range of motion;Decreased activity tolerance;Decreased mobility;Decreased knowledge of use of DME;Pain  PT Treatment Interventions DME instruction;Gait training;Stair training;Functional mobility training;Therapeutic activities;Therapeutic exercise;Patient/family education   PT  Goals (Current goals can be found in the Care Plan section) Acute Rehab PT Goals Patient Stated Goal: Walk without pain PT Goal Formulation: With patient Time For Goal Achievement: 01/15/15 Potential to Achieve Goals: Good    Frequency 7X/week   Barriers to discharge        Co-evaluation               End of Session Equipment Utilized  During Treatment: Gait belt Activity Tolerance: Patient tolerated treatment well Patient left: in chair;with call bell/phone within reach Nurse Communication: Mobility status         Time: [id number] PT Time Calculation (min) (ACUTE ONLY): 25 min   Charges:   PT Evaluation $Initial PT Evaluation Tier I: 1 Procedure PT Treatments $Gait Training: 8-22 mins   PT G Codes:        Francheska Villeda 01/08/2015, 5:54 PM

## 2015-01-08 NOTE — Interval H&P Note (Signed)
History and Physical Interval Note:  01/08/2015 10:19 AM  Rita PitcherPatricia K Watson  has presented today for surgery, with the diagnosis of left hip osteoarthitis  The various methods of treatment have been discussed with the patient and family. After consideration of risks, benefits and other options for treatment, the patient has consented to  Procedure(s): LEFT TOTAL HIP ARTHROPLASTY ANTERIOR APPROACH (Left) as a surgical intervention .  The patient's history has been reviewed, patient examined, no change in status, stable for surgery.  I have reviewed the patient's chart and labs.  Questions were answered to the patient's satisfaction.     Loanne DrillingALUISIO,Jermell Holeman V

## 2015-01-08 NOTE — Transfer of Care (Signed)
Immediate Anesthesia Transfer of Care Note  Patient: Rita Watson  Procedure(s) Performed: Procedure(s): LEFT TOTAL HIP ARTHROPLASTY ANTERIOR APPROACH (Left)  Patient Location: PACU  Anesthesia Type:MAC and Spinal  Level of Consciousness: awake, alert , oriented and patient cooperative  Airway & Oxygen Therapy: Patient Spontanous Breathing and Patient connected to face mask oxygen  Post-op Assessment: Report given to RN and Post -op Vital signs reviewed and stable  Post vital signs: Reviewed and stable  Last Vitals:  Filed Vitals:   01/08/15 0807  BP: 125/75  Pulse: 82  Temp: 36.9 C  Resp: 18    Complications: No apparent anesthesia complications

## 2015-01-08 NOTE — Anesthesia Procedure Notes (Addendum)
Procedure Name: MAC Date/Time: 01/08/2015 11:13 AM Performed by: Dione Booze Pre-anesthesia Checklist: Patient identified, Emergency Drugs available, Suction available and Patient being monitored Patient Re-evaluated:Patient Re-evaluated prior to inductionOxygen Delivery Method: Simple face mask Placement Confirmation: positive ETCO2   Spinal Patient location during procedure: OR Start time: 01/08/2015 11:12 AM End time: 01/08/2015 11:17 AM Staffing Anesthesiologist: Rod Mae Performed by: anesthesiologist  Preanesthetic Checklist Completed: patient identified, site marked, surgical consent, pre-op evaluation, timeout performed, IV checked, risks and benefits discussed and monitors and equipment checked Spinal Block Patient position: sitting Prep: Betadine Patient monitoring: heart rate, continuous pulse ox and blood pressure Approach: midline Location: L3-4 Injection technique: single-shot Needle Needle type: Whitacre  Needle gauge: 25 G Needle length: 9 cm Assessment Sensory level: T6 Additional Notes Expiration date of kit checked and confirmed. Patient tolerated procedure well, without complications.

## 2015-01-09 ENCOUNTER — Encounter (HOSPITAL_COMMUNITY): Payer: Self-pay | Admitting: Orthopedic Surgery

## 2015-01-09 LAB — BASIC METABOLIC PANEL
Anion gap: 4 — ABNORMAL LOW (ref 5–15)
BUN: 10 mg/dL (ref 6–20)
CO2: 27 mmol/L (ref 22–32)
Calcium: 8.1 mg/dL — ABNORMAL LOW (ref 8.9–10.3)
Chloride: 105 mmol/L (ref 101–111)
Creatinine, Ser: 0.49 mg/dL (ref 0.44–1.00)
GFR calc Af Amer: >60 mL/min (ref >60)
Glucose, Bld: 103 mg/dL — ABNORMAL HIGH (ref 65–99)
POTASSIUM: 3.6 mmol/L (ref 3.5–5.1)
SODIUM: 136 mmol/L (ref 135–145)

## 2015-01-09 LAB — CBC
HEMATOCRIT: 26 % — AB (ref 36.0–46.0)
HEMOGLOBIN: 8.6 g/dL — AB (ref 12.0–15.0)
MCH: 26.4 pg (ref 26.0–34.0)
MCHC: 33.1 g/dL (ref 30.0–36.0)
MCV: 79.8 fL (ref 78.0–100.0)
Platelets: 236 10*3/uL (ref 150–400)
RBC: 3.26 MIL/uL — AB (ref 3.87–5.11)
RDW: 16 % — ABNORMAL HIGH (ref 11.5–15.5)
WBC: 11.2 10*3/uL — ABNORMAL HIGH (ref 4.0–10.5)

## 2015-01-09 MED ORDER — POLYSACCHARIDE IRON COMPLEX 150 MG PO CAPS
150.0000 mg | ORAL_CAPSULE | Freq: Two times a day (BID) | ORAL | Status: DC
  Administered 2015-01-09: 150 mg via ORAL
  Filled 2015-01-09 (×2): qty 1

## 2015-01-09 MED ORDER — POLYSACCHARIDE IRON COMPLEX 150 MG PO CAPS: 150.0000 mg | ORAL_CAPSULE | Freq: Every day | ORAL | Status: DC

## 2015-01-09 MED ORDER — OXYCODONE HCL 5 MG PO TABS: 5.0000 mg | ORAL_TABLET | ORAL | Status: DC | PRN

## 2015-01-09 MED ORDER — RIVAROXABAN 10 MG PO TABS: 10.0000 mg | ORAL_TABLET | Freq: Every day | ORAL | Status: DC

## 2015-01-09 MED ORDER — METHOCARBAMOL 500 MG PO TABS: 500.0000 mg | ORAL_TABLET | Freq: Four times a day (QID) | ORAL | Status: DC | PRN

## 2015-01-09 MED ORDER — POLYSACCHARIDE IRON COMPLEX 150 MG PO CAPS: 150.0000 mg | ORAL_CAPSULE | Freq: Two times a day (BID) | ORAL | Status: DC

## 2015-01-09 NOTE — Evaluation (Addendum)
Occupational Therapy Evaluation Patient Details Name: Rita Watson MRN: [id number] DOB: [birth year] Today's Date: 01/09/2015    History of Present Illness L THR   Clinical Impression   Pt moves quickly at times and needs cues to go slower for safety. Advised pt to make sure she goes slowly with sitting up on EOB and with standing to make sure she doesn't feel dizzy. Practiced up to 3in1 and shower transfers. Pt hopeful for d/c later today. Will follow if here after today but pt doing well and ok to d/c from OT standpoint today.    Follow Up Recommendations  No OT follow up    Equipment Recommendations  None recommended by OT    Recommendations for Other Services       Precautions / Restrictions Precautions Precautions: Fall Restrictions Weight Bearing Restrictions: No Other Position/Activity Restrictions: WBAT      Mobility Bed Mobility Overal bed mobility: Needs Assistance Bed Mobility: Supine to Sit     Supine to sit: Min guard     General bed mobility comments: for safety as pt is impulsive.  Transfers Overall transfer level: Needs assistance Equipment used: Rolling walker (2 wheeled) Transfers: Sit to/from Stand Sit to Stand: Min guard         General transfer comment: verbal cues for hand placement and LE management.    Balance                                            ADL Overall ADL's : Needs assistance/impaired Eating/Feeding: Independent;Sitting   Grooming: Wash/dry hands;Set up;Sitting   Upper Body Bathing: Set up;Sitting   Lower Body Bathing: Minimal assistance;Sit to/from stand   Upper Body Dressing : Set up;Sitting   Lower Body Dressing: Minimal assistance;Sit to/from stand   Toilet Transfer: Min guard;Minimal assistance;Ambulation;Comfort height toilet;Grab bars;RW   Toileting- Architect and Hygiene: Min guard;Sit to/from stand   Tub/ Shower Transfer: Minimal assistance;Walk-in shower;Rolling  walker     General ADL Comments: Pt is impulsive at times, standing up from EOB before given instructions. Pt tends to step too close to the walker at times also and needs verbal cues for proper walker sequence and distance to self. Educated on shower transfer and sequence and how spouse should safely assist with steadying walker. Pt has a built in seat and advised pt to sit down for initial showers for safety. Pt stating she has a window ledge she can hold to with on and off commode and advised pt not to use towel bar or toilet paper hold for support. Pt with some difficulty stepping back over shower ledge on the first attempt but did better on second time practicing.     Vision     Perception     Praxis      Pertinent Vitals/Pain Pain Assessment: 0-10 Pain Score: 5  Pain Location: L hip Pain Descriptors / Indicators: Aching Pain Intervention(s): Repositioned;Ice applied     Hand Dominance Right   Extremity/Trunk Assessment Upper Extremity Assessment Upper Extremity Assessment: Overall WFL for tasks assessed           Communication Communication Communication: No difficulties   Cognition Arousal/Alertness: Awake/alert Behavior During Therapy: Impulsive Overall Cognitive Status: Within Functional Limits for tasks assessed                     General Comments  Exercises       Shoulder Instructions      Home Living Family/patient expects to be discharged to:: Private residence Living Arrangements: Spouse/significant other Available Help at Discharge: Family Type of Home: House Home Access: Stairs to enter Secretary/administrator of Steps: 3 Entrance Stairs-Rails: None Home Layout: One level     Bathroom Shower/Tub: Producer, television/film/video: Standard     Home Equipment: Cane - single point          Prior Functioning/Environment Level of Independence: Independent;Independent with assistive device(s)        Comments: Using cane  prior to admit    OT Diagnosis: Generalized weakness   OT Problem List: Decreased strength;Decreased knowledge of use of DME or AE;Decreased safety awareness   OT Treatment/Interventions: Self-care/ADL training;Patient/family education;Therapeutic activities;DME and/or AE instruction    OT Goals(Current goals can be found in the care plan section) Acute Rehab OT Goals Patient Stated Goal: return to independence OT Goal Formulation: With patient Time For Goal Achievement: 01/16/15 Potential to Achieve Goals: Good  OT Frequency: Min 2X/week   Barriers to D/C:            Co-evaluation              End of Session Equipment Utilized During Treatment: Rolling walker  Activity Tolerance: Patient tolerated treatment well Patient left: in chair;with call bell/phone within reach   Time: 1006-1035 OT Time Calculation (min): 29 min Charges:  OT General Charges $OT Visit: 1 Procedure OT Evaluation $Initial OT Evaluation Tier I: 1 Procedure OT Treatments $Therapeutic Activity: 8-22 mins G-Codes:    Lennox Laity  [phone] 01/09/2015, 10:48 AM
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
## 2015-01-09 NOTE — Plan of Care (Signed)
Problem: Consults Goal: Diagnosis- Total Joint Replacement Outcome: Completed/Met Date Met:  01/09/15 Primary Total Hip LEFT, Anterior  Problem: Phase III Progression Outcomes Goal: Anticoagulant follow-up in place Outcome: Not Applicable Date Met:  26/59/97 Xarelto VTE, no f/u needed.

## 2015-01-09 NOTE — Discharge Instructions (Addendum)
° °Dr. Frank Aluisio °Total Joint Specialist °[facility] °[address] °[place], [place] [address] °[phone] ° °ANTERIOR APPROACH TOTAL HIP REPLACEMENT POSTOPERATIVE DIRECTIONS ° ° °Hip Rehabilitation, Guidelines Following Surgery  °The results of a hip operation are greatly improved after range of motion and muscle strengthening exercises. Follow all safety measures which are given to protect your hip. If any of these exercises cause increased pain or swelling in your joint, decrease the amount until you are comfortable again. Then slowly increase the exercises. Call your caregiver if you have problems or questions.  ° °HOME CARE INSTRUCTIONS  °Remove items at home which could result in a fall. This includes throw rugs or furniture in walking pathways.  °· ICE to the affected hip every three hours for 30 minutes at a time and then as needed for pain and swelling.  Continue to use ice on the hip for pain and swelling from surgery. You may notice swelling that will progress down to the foot and ankle.  This is normal after surgery.  Elevate the leg when you are not up walking on it.   °· Continue to use the breathing machine which will help keep your temperature down.  It is common for your temperature to cycle up and down following surgery, especially at night when you are not up moving around and exerting yourself.  The breathing machine keeps your lungs expanded and your temperature down. °· Do not place pillow under knee, focus on keeping the knee straight while resting ° °DIET °You may resume your previous home diet once your are discharged from the hospital. ° °DRESSING / WOUND CARE / SHOWERING °You may shower 3 days after surgery, but keep the wounds dry during showering.  You may use an occlusive plastic wrap (Press'n Seal for example), NO SOAKING/SUBMERGING IN THE BATHTUB.  If the bandage gets wet, change with a clean dry gauze.  If the incision gets wet, pat the wound dry with  a clean towel. °You may start showering once you are discharged home but do not submerge the incision under water. Just pat the incision dry and apply a dry gauze dressing on daily. °Change the surgical dressing daily and reapply a dry dressing each time. ° °ACTIVITY °Walk with your walker as instructed. °Use walker as long as suggested by your caregivers. °Avoid periods of inactivity such as sitting longer than an hour when not asleep. This helps prevent blood clots.  °You may resume a sexual relationship in one month or when given the OK by your doctor.  °You may return to work once you are cleared by your doctor.  °Do not drive a car for 6 weeks or until released by you surgeon.  °Do not drive while taking narcotics. ° °WEIGHT BEARING °Weight bearing as tolerated with assist device (walker, cane, etc) as directed, use it as long as suggested by your surgeon or therapist, typically at least 4-6 weeks. ° °POSTOPERATIVE CONSTIPATION PROTOCOL °Constipation - defined medically as fewer than three stools per week and severe constipation as less than one stool per week. ° °One of the most common issues patients have following surgery is constipation.  Even if you have a regular bowel pattern at home, your normal regimen is likely to be disrupted due to multiple reasons following surgery.  Combination of anesthesia, postoperative narcotics, change in appetite and fluid intake all can affect your bowels.  In order to avoid complications following surgery, here are some recommendations in order to   help you during your recovery period.  Colace (docusate) - Pick up an over-the-counter form of Colace or another stool softener and take twice a day as long as you are requiring postoperative pain medications.  Take with a full glass of water daily.  If you experience loose stools or diarrhea, hold the colace until you stool forms back up.  If your symptoms do not get better within 1 week or if they get worse, check with your  doctor.  Dulcolax (bisacodyl) - Pick up over-the-counter and take as directed by the product packaging as needed to assist with the movement of your bowels.  Take with a full glass of water.  Use this product as needed if not relieved by Colace only.   MiraLax (polyethylene glycol) - Pick up over-the-counter to have on hand.  MiraLax is a solution that will increase the amount of water in your bowels to assist with bowel movements.  Take as directed and can mix with a glass of water, juice, soda, coffee, or tea.  Take if you go more than two days without a movement. Do not use MiraLax more than once per day. Call your doctor if you are still constipated or irregular after using this medication for 7 days in a row.  If you continue to have problems with postoperative constipation, please contact the office for further assistance and recommendations.  If you experience "the worst abdominal pain ever" or develop nausea or vomiting, please contact the office immediatly for further recommendations for treatment.  ITCHING  If you experience itching with your medications, try taking only a single pain pill, or even half a pain pill at a time.  You can also use Benadryl over the counter for itching or also to help with sleep.   TED HOSE STOCKINGS Wear the elastic stockings on both legs for three weeks following surgery during the day but you may remove then at night for sleeping.  MEDICATIONS See your medication summary on the After Visit Summary that the nursing staff will review with you prior to discharge.  You may have some home medications which will be placed on hold until you complete the course of blood thinner medication.  It is important for you to complete the blood thinner medication as prescribed by your surgeon.  Continue your approved medications as instructed at time of discharge.  PRECAUTIONS If you experience chest pain or shortness of breath - call 911 immediately for transfer to the  hospital emergency department.  If you develop a fever greater that 101 F, purulent drainage from wound, increased redness or drainage from wound, foul odor from the wound/dressing, or calf pain - CONTACT YOUR SURGEON.                                                   FOLLOW-UP APPOINTMENTS Make sure you keep all of your appointments after your operation with your surgeon and caregivers. You should call the office at the above phone number and make an appointment for approximately two weeks after the date of your surgery or on the date instructed by your surgeon outlined in the "After Visit Summary".  RANGE OF MOTION AND STRENGTHENING EXERCISES  These exercises are designed to help you keep full movement of your hip joint. Follow your caregiver's or physical therapist's instructions. Perform all exercises about fifteen  times, three times per day or as directed. Exercise both hips, even if you have had only one joint replacement. These exercises can be done on a training (exercise) mat, on the floor, on a table or on a bed. Use whatever works the best and is most comfortable for you. Use music or television while you are exercising so that the exercises are a pleasant break in your day. This will make your life better with the exercises acting as a break in routine you can look forward to.  Lying on your back, slowly slide your foot toward your buttocks, raising your knee up off the floor. Then slowly slide your foot back down until your leg is straight again.  Lying on your back spread your legs as far apart as you can without causing discomfort.  Lying on your side, raise your upper leg and foot straight up from the floor as far as is comfortable. Slowly lower the leg and repeat.  Lying on your back, tighten up the muscle in the front of your thigh (quadriceps muscles). You can do this by keeping your leg straight and trying to raise your heel off the floor. This helps strengthen the largest muscle  supporting your knee.  Lying on your back, tighten up the muscles of your buttocks both with the legs straight and with the knee bent at a comfortable angle while keeping your heel on the floor.   IF YOU ARE TRANSFERRED TO A SKILLED REHAB FACILITY If the patient is transferred to a skilled rehab facility following release from the hospital, a list of the current medications will be sent to the facility for the patient to continue.  When discharged from the skilled rehab facility, please have the facility set up the patient's Home Health Physical Therapy prior to being released. Also, the skilled facility will be responsible for providing the patient with their medications at time of release from the facility to include their pain medication, the muscle relaxants, and their blood thinner medication. If the patient is still at the rehab facility at time of the two week follow up appointment, the skilled rehab facility will also need to assist the patient in arranging follow up appointment in our office and any transportation needs.  MAKE SURE YOU:  Understand these instructions.  Get help right away if you are not doing well or get worse.    Pick up stool softner and laxative for home use following surgery while on pain medications. Do not submerge incision under water. Please use good hand washing techniques while changing dressing each day. May shower starting three days after surgery on Saturday 6.11.2016. May start changing dressing tomorrow, Friday 6.10.2016. Please use a clean towel to pat the incision dry following showers. Continue to use ice for pain and swelling after surgery. Do not use any lotions or creams on the incision until instructed by your surgeon.  Take Xarelto for two and a half more weeks, then discontinue Xarelto. Once the patient has completed the blood thinner regimen, then take a Baby 81 mg Aspirin daily for three more weeks.  Information on my medicine - XARELTO  (Rivaroxaban)  This medication education was reviewed with me or my healthcare representative as part of my discharge preparation.  The pharmacist that spoke with me during my hospital stay was:  Rollene Fare, RPH  Why was Xarelto prescribed for you? Xarelto was prescribed for you to reduce the risk of blood clots forming after orthopedic surgery. The medical  term for these abnormal blood clots is venous thromboembolism (VTE).  What do you need to know about xarelto ? Take your Xarelto ONCE DAILY at the same time every day. You may take it either with or without food.  If you have difficulty swallowing the tablet whole, you may crush it and mix in applesauce just prior to taking your dose.  Take Xarelto exactly as prescribed by your doctor and DO NOT stop taking Xarelto without talking to the doctor who prescribed the medication.  Stopping without other VTE prevention medication to take the place of Xarelto may increase your risk of developing a clot.  After discharge, you should have regular check-up appointments with your healthcare provider that is prescribing your Xarelto.    What do you do if you miss a dose? If you miss a dose, take it as soon as you remember on the same day then continue your regularly scheduled once daily regimen the next day. Do not take two doses of Xarelto on the same day.   Important Safety Information A possible side effect of Xarelto is bleeding. You should call your healthcare provider right away if you experience any of the following: ? Bleeding from an injury or your nose that does not stop. ? Unusual colored urine (red or dark brown) or unusual colored stools (red or black). ? Unusual bruising for unknown reasons. ? A serious fall or if you hit your head (even if there is no bleeding).  Some medicines may interact with Xarelto and might increase your risk of bleeding while on Xarelto. To help avoid this, consult your healthcare provider  or pharmacist prior to using any new prescription or non-prescription medications, including herbals, vitamins, non-steroidal anti-inflammatory drugs (NSAIDs) and supplements.  This website has more information on Xarelto: VisitDestination.com.br.
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
## 2015-01-09 NOTE — Progress Notes (Signed)
Physical Therapy Treatment Patient Details Name: Rita Watson MRN: [id number] DOB: 1956-06-14 Today's Date: 01/09/2015    History of Present Illness L THR    PT Comments    Pt eager for dc.  Reviewed car transfers and stairs bkwd with pt and spouse.  Follow Up Recommendations  Home health PT     Equipment Recommendations  Rolling walker with 5" wheels    Recommendations for Other Services OT consult     Precautions / Restrictions Precautions Precautions: Fall Restrictions Weight Bearing Restrictions: No Other Position/Activity Restrictions: WBAT    Mobility  Bed Mobility Overal bed mobility: Needs Assistance Bed Mobility: Supine to Sit     Supine to sit: Min guard     General bed mobility comments: OOB with OT  Transfers Overall transfer level: Needs assistance Equipment used: Rolling walker (2 wheeled) Transfers: Sit to/from Stand Sit to Stand: Supervision         General transfer comment: min cues cues for hand placement and LE management.  Ambulation/Gait Ambulation/Gait assistance: Min guard;Supervision Ambulation Distance (Feet): 180 Feet Assistive device: Rolling walker (2 wheeled) Gait Pattern/deviations: Step-to pattern;Step-through pattern;Decreased step length - right;Decreased step length - left;Shuffle;Trunk flexed Gait velocity: decr   General Gait Details: cues for posture, position from RW and initial sequence   Stairs Stairs: Yes Stairs assistance: Min assist Stair Management: No rails;Step to pattern;With walker;Backwards Number of Stairs: 6 General stair comments: Reviewed stairs with pt and spouse.  Wheelchair Mobility    Modified Rankin (Stroke Patients Only)       Balance                                    Cognition Arousal/Alertness: Awake/alert Behavior During Therapy: WFL for tasks assessed/performed Overall Cognitive Status: Within Functional Limits for tasks assessed                       Exercises Total Joint Exercises Ankle Circles/Pumps: AROM;Both;15 reps;Supine Quad Sets: AROM;Both;10 reps;Supine Heel Slides: AAROM;Left;20 reps;Supine Hip ABduction/ADduction: AAROM;Left;15 reps;Supine    General Comments        Pertinent Vitals/Pain Pain Assessment: 0-10 Pain Score: 4  Pain Location: L hip Pain Descriptors / Indicators: Aching Pain Intervention(s): Limited activity within patient's tolerance;Monitored during session;Premedicated before session;Ice applied    Home Living Family/patient expects to be discharged to:: Private residence Living Arrangements: Spouse/significant other Available Help at Discharge: Family Type of Home: House Home Access: Stairs to enter Entrance Stairs-Rails: None Home Layout: One level Home Equipment: Cane - single point      Prior Function Level of Independence: Independent;Independent with assistive device(s)      Comments: Using cane prior to admit   PT Goals (current goals can now be found in the care plan section) Acute Rehab PT Goals Patient Stated Goal: return to independence PT Goal Formulation: With patient Time For Goal Achievement: 01/15/15 Potential to Achieve Goals: Good Progress towards PT goals: Progressing toward goals    Frequency  7X/week    PT Plan Current plan remains appropriate    Co-evaluation             End of Session Equipment Utilized During Treatment: Gait belt Activity Tolerance: Patient tolerated treatment well Patient left: in chair;with call bell/phone within reach     Time: 7829-5621 PT Time Calculation (min) (ACUTE ONLY): 10 min  Charges:  $Gait Training: 8-22 mins $Therapeutic Exercise:  8-22 mins $Therapeutic Activity: 8-22 mins                    G Codes:      Rita Watson 01/31/15, 1:39 PM

## 2015-01-09 NOTE — Progress Notes (Signed)
Physical Therapy Treatment Patient Details Name: Rita Watson MRN: [id number] DOB: 19-Nov-1955 Today's Date: 01/09/2015    History of Present Illness L THR    PT Comments    Pt progressing well with mobility.  Reviewed stairs and car transfers.  Will return in pm to review with spouse.  Follow Up Recommendations  Home health PT     Equipment Recommendations  Rolling walker with 5" wheels    Recommendations for Other Services OT consult     Precautions / Restrictions Precautions Precautions: Fall Restrictions Weight Bearing Restrictions: No Other Position/Activity Restrictions: WBAT    Mobility  Bed Mobility Overal bed mobility: Needs Assistance Bed Mobility: Supine to Sit     Supine to sit: Min guard     General bed mobility comments: OOB with OT  Transfers Overall transfer level: Needs assistance Equipment used: Rolling walker (2 wheeled) Transfers: Sit to/from Stand Sit to Stand: Supervision         General transfer comment: min cues cues for hand placement and LE management.  Ambulation/Gait Ambulation/Gait assistance: Min guard;Supervision Ambulation Distance (Feet): 180 Feet Assistive device: Rolling walker (2 wheeled) Gait Pattern/deviations: Step-to pattern;Step-through pattern;Decreased step length - right;Decreased step length - left;Shuffle;Trunk flexed Gait velocity: decr   General Gait Details: cues for posture, position from RW and initial sequence   Stairs Stairs: Yes Stairs assistance: Min assist Stair Management: No rails;Step to pattern;With walker;Backwards Number of Stairs: 6 General stair comments: cues for sequence, posture and position from RW  Wheelchair Mobility    Modified Rankin (Stroke Patients Only)       Balance                                    Cognition Arousal/Alertness: Awake/alert Behavior During Therapy: Impulsive Overall Cognitive Status: Within Functional Limits for tasks  assessed                      Exercises Total Joint Exercises Ankle Circles/Pumps: AROM;Both;15 reps;Supine Quad Sets: AROM;Both;10 reps;Supine Heel Slides: AAROM;Left;20 reps;Supine Hip ABduction/ADduction: AAROM;Left;15 reps;Supine    General Comments        Pertinent Vitals/Pain Pain Assessment: 0-10 Pain Score: 5  Pain Location: L hip Pain Descriptors / Indicators: Aching;Sore Pain Intervention(s): Limited activity within patient's tolerance;Monitored during session;Premedicated before session;Ice applied    Home Living Family/patient expects to be discharged to:: Private residence Living Arrangements: Spouse/significant other Available Help at Discharge: Family Type of Home: House Home Access: Stairs to enter Entrance Stairs-Rails: None Home Layout: One level Home Equipment: Cane - single point      Prior Function Level of Independence: Independent;Independent with assistive device(s)      Comments: Using cane prior to admit   PT Goals (current goals can now be found in the care plan section) Acute Rehab PT Goals Patient Stated Goal: return to independence PT Goal Formulation: With patient Time For Goal Achievement: 01/15/15 Potential to Achieve Goals: Good Progress towards PT goals: Progressing toward goals    Frequency  7X/week    PT Plan Current plan remains appropriate    Co-evaluation             End of Session Equipment Utilized During Treatment: Gait belt Activity Tolerance: Patient tolerated treatment well Patient left: in chair;with call bell/phone within reach     Time: 1100-1146 PT Time Calculation (min) (ACUTE ONLY): 46 min  Charges:  $Gait Training:  8-22 mins $Therapeutic Exercise: 8-22 mins $Therapeutic Activity: 8-22 mins                    G Codes:      Ephrem Carrick 01/30/15, 1:36 PM

## 2015-09-25 ENCOUNTER — Other Ambulatory Visit: Payer: Self-pay | Admitting: General Surgery

## 2018-06-09 ENCOUNTER — Other Ambulatory Visit: Payer: Self-pay | Admitting: Family Medicine

## 2018-06-09 ENCOUNTER — Other Ambulatory Visit (HOSPITAL_COMMUNITY)
Admission: RE | Admit: 2018-06-09 | Discharge: 2018-06-09 | Disposition: A | Discharge status: Home / Self Care | Payer: Managed Care, Other (non HMO) | Source: Ambulatory Visit | Attending: Family Medicine | Admitting: Family Medicine

## 2018-06-09 DIAGNOSIS — Z01411 Encounter for gynecological examination (general) (routine) with abnormal findings: Secondary | ICD-10-CM | POA: Insufficient documentation

## 2018-06-14 LAB — CYTOLOGY - PAP: HPV: DETECTED — AB
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
## 2018-07-28 NOTE — Patient Instructions (Addendum)
Rita Watson  07/28/2018   Your procedure is scheduled on: 08-09-18   Report to [facility] Main  Entrance     Report to admitting at 6:00AM    Call this number if you have problems the morning of surgery [phone]      Remember: Do not eat food or drink liquids :After Midnight. BRUSH YOUR TEETH MORNING OF SURGERY AND RINSE YOUR MOUTH OUT, NO CHEWING GUM CANDY OR MINTS.     Take these medicines the morning of surgery with A SIP OF WATER: Depakote, Prozac, Omeprazole                                 You may not have any metal on your body including hair pins and              piercings  Do not wear jewelry, make-up, lotions, powders or perfumes, deodorant             Do not wear nail polish.  Do not shave  48 hours prior to surgery.           Do not bring valuables to the hospital. [facility] IS NOT             RESPONSIBLE   FOR VALUABLES.  Contacts, dentures or bridgework may not be worn into surgery.  Leave suitcase in the car. After surgery it may be brought to your room.                   Please read over the following fact sheets you were given: _____________________________________________________________________             [facility] - Preparing for Surgery Before surgery, you can play an important role.  Because skin is not sterile, your skin needs to be as free of germs as possible.  You can reduce the number of germs on your skin by washing with CHG (chlorahexidine gluconate) soap before surgery.  CHG is an antiseptic cleaner which kills germs and bonds with the skin to continue killing germs even after washing. Please DO NOT use if you have an allergy to CHG or antibacterial soaps.  If your skin becomes reddened/irritated stop using the CHG and inform your nurse when you arrive at Short Stay. Do not shave (including legs and underarms) for at least 48 hours prior to the first CHG shower.  You may shave your face/neck. Please follow  these instructions carefully:  1.  Shower with CHG Soap the night before surgery and the  morning of Surgery.  2.  If you choose to wash your hair, wash your hair first as usual with your  normal  shampoo.  3.  After you shampoo, rinse your hair and body thoroughly to remove the  shampoo.                           4.  Use CHG as you would any other liquid soap.  You can apply chg directly  to the skin and wash                       Gently with a scrungie or clean washcloth.  5.  Apply the CHG Soap to your body ONLY FROM THE NECK DOWN.  Do not use on face/ open                           Wound or open sores. Avoid contact with eyes, ears mouth and genitals (private parts).                       Wash face,  Genitals (private parts) with your normal soap.             6.  Wash thoroughly, paying special attention to the area where your surgery  will be performed.  7.  Thoroughly rinse your body with warm water from the neck down.  8.  DO NOT shower/wash with your normal soap after using and rinsing off  the CHG Soap.                9.  Pat yourself dry with a clean towel.            10.  Wear clean pajamas.            11.  Place clean sheets on your bed the night of your first shower and do not  sleep with pets. Day of Surgery : Do not apply any lotions/deodorants the morning of surgery.  Please wear clean clothes to the hospital/surgery center.  FAILURE TO FOLLOW THESE INSTRUCTIONS MAY RESULT IN THE CANCELLATION OF YOUR SURGERY PATIENT SIGNATURE_________________________________  NURSE SIGNATURE__________________________________  ________________________________________________________________________   Adam Phenix  An incentive spirometer is a tool that can help keep your lungs clear and active. This tool measures how well you are filling your lungs with each breath. Taking long deep breaths may help reverse or decrease the chance of developing breathing (pulmonary) problems  (especially infection) following:  A long period of time when you are unable to move or be active. BEFORE THE PROCEDURE   If the spirometer includes an indicator to show your best effort, your nurse or respiratory therapist will set it to a desired goal.  If possible, sit up straight or lean slightly forward. Try not to slouch.  Hold the incentive spirometer in an upright position. INSTRUCTIONS FOR USE  1. Sit on the edge of your bed if possible, or sit up as far as you can in bed or on a chair. 2. Hold the incentive spirometer in an upright position. 3. Breathe out normally. 4. Place the mouthpiece in your mouth and seal your lips tightly around it. 5. Breathe in slowly and as deeply as possible, raising the piston or the ball toward the top of the column. 6. Hold your breath for 3-5 seconds or for as long as possible. Allow the piston or ball to fall to the bottom of the column. 7. Remove the mouthpiece from your mouth and breathe out normally. 8. Rest for a few seconds and repeat Steps 1 through 7 at least 10 times every 1-2 hours when you are awake. Take your time and take a few normal breaths between deep breaths. 9. The spirometer may include an indicator to show your best effort. Use the indicator as a goal to work toward during each repetition. 10. After each set of 10 deep breaths, practice coughing to be sure your lungs are clear. If you have an incision (the cut made at the time of surgery), support your incision when coughing by placing a pillow or rolled up towels firmly against it. Once you are able to get out of  bed, walk around indoors and cough well. You may stop using the incentive spirometer when instructed by your caregiver.  RISKS AND COMPLICATIONS  Take your time so you do not get dizzy or light-headed.  If you are in pain, you may need to take or ask for pain medication before doing incentive spirometry. It is harder to take a deep breath if you are having  pain. AFTER USE  Rest and breathe slowly and easily.  It can be helpful to keep track of a log of your progress. Your caregiver can provide you with a simple table to help with this. If you are using the spirometer at home, follow these instructions: [place] IF:   You are having difficultly using the spirometer.  You have trouble using the spirometer as often as instructed.  Your pain medication is not giving enough relief while using the spirometer.  You develop fever of 100.5 F (38.1 C) or higher. SEEK IMMEDIATE MEDICAL CARE IF:   You cough up bloody sputum that had not been present before.  You develop fever of 102 F (38.9 C) or greater.  You develop worsening pain at or near the incision site. MAKE SURE YOU:   Understand these instructions.  Will watch your condition.  Will get help right away if you are not doing well or get worse. Document Released: 11/29/2006 Document Revised: 10/11/2011 Document Reviewed: 01/30/2007 ExitCare Patient Information 2014 ExitCare, [place].   ________________________________________________________________________  WHAT IS A BLOOD TRANSFUSION? Blood Transfusion Information  A transfusion is the replacement of blood or some of its parts. Blood is made up of multiple cells which provide different functions.  Red blood cells carry oxygen and are used for blood loss replacement.  White blood cells fight against infection.  Platelets control bleeding.  Plasma helps clot blood.  Other blood products are available for specialized needs, such as hemophilia or other clotting disorders. BEFORE THE TRANSFUSION  Who gives blood for transfusions?   Healthy volunteers who are fully evaluated to make sure their blood is safe. This is blood bank blood. Transfusion therapy is the safest it has ever been in the practice of medicine. Before blood is taken from a donor, a complete history is taken to make sure that person has no history  of diseases nor engages in risky social behavior (examples are intravenous drug use or sexual activity with multiple partners). The donor's travel history is screened to minimize risk of transmitting infections, such as malaria. The donated blood is tested for signs of infectious diseases, such as HIV and hepatitis. The blood is then tested to be sure it is compatible with you in order to minimize the chance of a transfusion reaction. If you or a relative donates blood, this is often done in anticipation of surgery and is not appropriate for emergency situations. It takes many days to process the donated blood. RISKS AND COMPLICATIONS Although transfusion therapy is very safe and saves many lives, the main dangers of transfusion include:   Getting an infectious disease.  Developing a transfusion reaction. This is an allergic reaction to something in the blood you were given. Every precaution is taken to prevent this. The decision to have a blood transfusion has been considered carefully by your caregiver before blood is given. Blood is not given unless the benefits outweigh the risks. AFTER THE TRANSFUSION  Right after receiving a blood transfusion, you will usually feel much better and more energetic. This is especially true if your red blood  cells have gotten low (anemic). The transfusion raises the level of the red blood cells which carry oxygen, and this usually causes an energy increase.  The nurse administering the transfusion will monitor you carefully for complications. HOME CARE INSTRUCTIONS  No special instructions are needed after a transfusion. You may find your energy is better. Speak with your caregiver about any limitations on activity for underlying diseases you may have. SEEK MEDICAL CARE IF:   Your condition is not improving after your transfusion.  You develop redness or irritation at the intravenous (IV) site. SEEK IMMEDIATE MEDICAL CARE IF:  Any of the following symptoms  occur over the next 12 hours:  Shaking chills.  You have a temperature by mouth above 102 F (38.9 C), not controlled by medicine.  Chest, back, or muscle pain.  People around you feel you are not acting correctly or are confused.  Shortness of breath or difficulty breathing.  Dizziness and fainting.  You get a rash or develop hives.  You have a decrease in urine output.  Your urine turns a dark color or changes to pink, red, or brown. Any of the following symptoms occur over the next 10 days:  You have a temperature by mouth above 102 F (38.9 C), not controlled by medicine.  Shortness of breath.  Weakness after normal activity.  The white part of the eye turns yellow (jaundice).  You have a decrease in the amount of urine or are urinating less often.  Your urine turns a dark color or changes to pink, red, or brown. Document Released: 07/16/2000 Document Revised: 10/11/2011 Document Reviewed: 03/04/2008 [facility] Patient Information 2014 [place], [place].  _______________________________________________________________________

## 2018-07-31 ENCOUNTER — Encounter (HOSPITAL_COMMUNITY)
Admission: RE | Admit: 2018-07-31 | Discharge: 2018-07-31 | Disposition: A | Discharge status: Home / Self Care | Payer: Managed Care, Other (non HMO) | Source: Ambulatory Visit | Attending: Orthopedic Surgery | Admitting: Orthopedic Surgery

## 2018-07-31 ENCOUNTER — Other Ambulatory Visit: Payer: Self-pay

## 2018-07-31 ENCOUNTER — Encounter (HOSPITAL_COMMUNITY): Payer: Self-pay

## 2018-07-31 DIAGNOSIS — M1612 Unilateral primary osteoarthritis, left hip: Secondary | ICD-10-CM | POA: Diagnosis not present

## 2018-07-31 DIAGNOSIS — Z01812 Encounter for preprocedural laboratory examination: Secondary | ICD-10-CM | POA: Insufficient documentation

## 2018-07-31 LAB — COMPREHENSIVE METABOLIC PANEL WITH GFR
ALT: 13 U/L (ref 0–44)
AST: 17 U/L (ref 15–41)
Albumin: 4.3 g/dL (ref 3.5–5.0)
Alkaline Phosphatase: 64 U/L (ref 38–126)
Anion gap: 9 (ref 5–15)
BUN: 22 mg/dL (ref 8–23)
CO2: 26 mmol/L (ref 22–32)
Calcium: 8.7 mg/dL — ABNORMAL LOW (ref 8.9–10.3)
Chloride: 105 mmol/L (ref 98–111)
Creatinine, Ser: 0.58 mg/dL (ref 0.44–1.00)
GFR calc Af Amer: >60 mL/min
GFR calc non Af Amer: >60 mL/min
Glucose, Bld: 97 mg/dL (ref 70–99)
Potassium: 4.2 mmol/L (ref 3.5–5.1)
Sodium: 140 mmol/L (ref 135–145)
Total Bilirubin: 0.4 mg/dL (ref 0.3–1.2)
Total Protein: 6.8 g/dL (ref 6.5–8.1)

## 2018-07-31 LAB — CBC
HCT: 38.1 % (ref 36.0–46.0)
HEMOGLOBIN: 12 g/dL (ref 12.0–15.0)
MCH: 27 pg (ref 26.0–34.0)
MCHC: 31.5 g/dL (ref 30.0–36.0)
MCV: 85.8 fL (ref 80.0–100.0)
Platelets: 235 10*3/uL (ref 150–400)
RBC: 4.44 MIL/uL (ref 3.87–5.11)
RDW: 13.2 % (ref 11.5–15.5)
WBC: 8.3 10*3/uL (ref 4.0–10.5)
nRBC: 0 % (ref 0.0–0.2)

## 2018-07-31 LAB — SURGICAL PCR SCREEN
MRSA, PCR: NEGATIVE
Staphylococcus aureus: NEGATIVE

## 2018-07-31 LAB — PROTIME-INR
INR: 1.03
Prothrombin Time: 13.4 s (ref 11.4–15.2)

## 2018-07-31 LAB — APTT: aPTT: 30 s (ref 24–36)

## 2018-08-08 ENCOUNTER — Emergency Department (HOSPITAL_COMMUNITY)
Admission: EM | Admit: 2018-08-08 | Discharge: 2018-08-08 | Disposition: A | Discharge status: Home / Self Care | Payer: Managed Care, Other (non HMO) | Source: Home / Self Care | Attending: Emergency Medicine | Admitting: Emergency Medicine

## 2018-08-08 ENCOUNTER — Other Ambulatory Visit: Payer: Self-pay

## 2018-08-08 ENCOUNTER — Encounter (HOSPITAL_COMMUNITY): Payer: Self-pay | Admitting: Emergency Medicine

## 2018-08-08 DIAGNOSIS — M25551 Pain in right hip: Secondary | ICD-10-CM | POA: Diagnosis not present

## 2018-08-08 DIAGNOSIS — M1611 Unilateral primary osteoarthritis, right hip: Secondary | ICD-10-CM | POA: Diagnosis not present

## 2018-08-08 DIAGNOSIS — Z79899 Other long term (current) drug therapy: Secondary | ICD-10-CM | POA: Insufficient documentation

## 2018-08-08 DIAGNOSIS — M62838 Other muscle spasm: Secondary | ICD-10-CM

## 2018-08-08 DIAGNOSIS — M62830 Muscle spasm of back: Secondary | ICD-10-CM | POA: Insufficient documentation

## 2018-08-08 NOTE — ED Triage Notes (Signed)
Pt reports she took 1000 mg of robaxin at home PTA.

## 2018-08-08 NOTE — ED Triage Notes (Signed)
BIB EMS from home, pt reports lower back pain onset earlier this evening. Denies injury. Worse with movement, subsides with rest. VSS.
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
## 2018-08-08 NOTE — ED Notes (Signed)
E-signature not available, pt verbalized understanding of DC instructions  

## 2018-08-09 ENCOUNTER — Encounter (HOSPITAL_COMMUNITY)
Admission: RE | Disposition: A | Discharge status: Home Health | Payer: Self-pay | Source: Home / Self Care | Attending: Orthopedic Surgery

## 2018-08-09 ENCOUNTER — Inpatient Hospital Stay (HOSPITAL_COMMUNITY): Payer: Managed Care, Other (non HMO) | Admitting: Physician Assistant

## 2018-08-09 ENCOUNTER — Inpatient Hospital Stay (HOSPITAL_COMMUNITY)
Admission: RE | Admit: 2018-08-09 | Discharge: 2018-08-10 | DRG: 470 | Disposition: A | Discharge status: Home Health | Payer: Managed Care, Other (non HMO) | Attending: Orthopedic Surgery | Admitting: Orthopedic Surgery

## 2018-08-09 ENCOUNTER — Inpatient Hospital Stay (HOSPITAL_COMMUNITY): Payer: Managed Care, Other (non HMO)

## 2018-08-09 ENCOUNTER — Encounter (HOSPITAL_COMMUNITY): Payer: Self-pay | Admitting: Certified Registered Nurse Anesthetist

## 2018-08-09 ENCOUNTER — Other Ambulatory Visit: Payer: Self-pay

## 2018-08-09 ENCOUNTER — Inpatient Hospital Stay (HOSPITAL_COMMUNITY): Payer: Managed Care, Other (non HMO) | Admitting: Certified Registered Nurse Anesthetist

## 2018-08-09 DIAGNOSIS — Z96649 Presence of unspecified artificial hip joint: Secondary | ICD-10-CM

## 2018-08-09 DIAGNOSIS — Z96642 Presence of left artificial hip joint: Secondary | ICD-10-CM | POA: Diagnosis present

## 2018-08-09 DIAGNOSIS — Z419 Encounter for procedure for purposes other than remedying health state, unspecified: Secondary | ICD-10-CM

## 2018-08-09 DIAGNOSIS — F419 Anxiety disorder, unspecified: Secondary | ICD-10-CM | POA: Diagnosis present

## 2018-08-09 DIAGNOSIS — R569 Unspecified convulsions: Secondary | ICD-10-CM | POA: Diagnosis present

## 2018-08-09 DIAGNOSIS — Z882 Allergy status to sulfonamides status: Secondary | ICD-10-CM

## 2018-08-09 DIAGNOSIS — M1611 Unilateral primary osteoarthritis, right hip: Secondary | ICD-10-CM | POA: Diagnosis present

## 2018-08-09 DIAGNOSIS — M169 Osteoarthritis of hip, unspecified: Secondary | ICD-10-CM | POA: Diagnosis present

## 2018-08-09 DIAGNOSIS — M25551 Pain in right hip: Secondary | ICD-10-CM | POA: Diagnosis present

## 2018-08-09 HISTORY — PX: TOTAL HIP ARTHROPLASTY: SHX124

## 2018-08-09 LAB — TYPE AND SCREEN
ABO/RH(D): B NEG
Antibody Screen: NEGATIVE

## 2018-08-09 SURGERY — ARTHROPLASTY, HIP, TOTAL, ANTERIOR APPROACH
Anesthesia: Monitor Anesthesia Care | Site: Hip | Laterality: Right

## 2018-08-09 MED ORDER — ONDANSETRON HCL 4 MG/2ML IJ SOLN
INTRAMUSCULAR | Status: AC
  Filled 2018-08-09: qty 2

## 2018-08-09 MED ORDER — DEXAMETHASONE SODIUM PHOSPHATE 10 MG/ML IJ SOLN
INTRAMUSCULAR | Status: DC | PRN
  Administered 2018-08-09: 10 mg via INTRAVENOUS

## 2018-08-09 MED ORDER — ONDANSETRON HCL 4 MG/2ML IJ SOLN: 4.0000 mg | Freq: Four times a day (QID) | INTRAMUSCULAR | Status: DC | PRN

## 2018-08-09 MED ORDER — ASPIRIN EC 325 MG PO TBEC
325.0000 mg | DELAYED_RELEASE_TABLET | Freq: Two times a day (BID) | ORAL | Status: DC
  Administered 2018-08-10: 325 mg via ORAL
  Filled 2018-08-09: qty 1

## 2018-08-09 MED ORDER — SODIUM CHLORIDE 0.9 % IV SOLN
INTRAVENOUS | Status: DC | PRN
  Administered 2018-08-09: 50 ug/min via INTRAVENOUS

## 2018-08-09 MED ORDER — MORPHINE SULFATE (PF) 2 MG/ML IV SOLN: 0.5000 mg | INTRAVENOUS | Status: DC | PRN

## 2018-08-09 MED ORDER — ONDANSETRON HCL 4 MG/2ML IJ SOLN
INTRAMUSCULAR | Status: DC | PRN
  Administered 2018-08-09: 4 mg via INTRAVENOUS

## 2018-08-09 MED ORDER — FLEET ENEMA 7-19 GM/118ML RE ENEM: 1.0000 | ENEMA | Freq: Once | RECTAL | Status: DC | PRN

## 2018-08-09 MED ORDER — BISACODYL 10 MG RE SUPP: 10.0000 mg | Freq: Every day | RECTAL | Status: DC | PRN

## 2018-08-09 MED ORDER — PROPOFOL 10 MG/ML IV BOLUS
INTRAVENOUS | Status: DC | PRN
  Administered 2018-08-09: 30 mg via INTRAVENOUS

## 2018-08-09 MED ORDER — PHENYLEPHRINE 40 MCG/ML (10ML) SYRINGE FOR IV PUSH (FOR BLOOD PRESSURE SUPPORT)
PREFILLED_SYRINGE | INTRAVENOUS | Status: AC
  Filled 2018-08-09: qty 10

## 2018-08-09 MED ORDER — OXYCODONE HCL 5 MG PO TABS: 5.0000 mg | ORAL_TABLET | Freq: Once | ORAL | Status: DC | PRN

## 2018-08-09 MED ORDER — TRANEXAMIC ACID-NACL 1000-0.7 MG/100ML-% IV SOLN
1000.0000 mg | INTRAVENOUS | Status: AC
  Administered 2018-08-09: 1000 mg via INTRAVENOUS
  Filled 2018-08-09: qty 100

## 2018-08-09 MED ORDER — DEXAMETHASONE SODIUM PHOSPHATE 10 MG/ML IJ SOLN
INTRAMUSCULAR | Status: AC
  Filled 2018-08-09: qty 1

## 2018-08-09 MED ORDER — FLUOXETINE HCL 20 MG PO CAPS
40.0000 mg | ORAL_CAPSULE | Freq: Every morning | ORAL | Status: DC
  Administered 2018-08-09 – 2018-08-10 (×2): 40 mg via ORAL
  Filled 2018-08-09 (×2): qty 2

## 2018-08-09 MED ORDER — METOCLOPRAMIDE HCL 5 MG/ML IJ SOLN: 5.0000 mg | Freq: Three times a day (TID) | INTRAMUSCULAR | Status: DC | PRN

## 2018-08-09 MED ORDER — CEFAZOLIN SODIUM-DEXTROSE 2-4 GM/100ML-% IV SOLN
2.0000 g | INTRAVENOUS | Status: AC
  Administered 2018-08-09: 2 g via INTRAVENOUS
  Filled 2018-08-09: qty 100

## 2018-08-09 MED ORDER — PROPOFOL 10 MG/ML IV BOLUS
INTRAVENOUS | Status: AC
  Filled 2018-08-09: qty 20

## 2018-08-09 MED ORDER — ONDANSETRON HCL 4 MG PO TABS: 4.0000 mg | ORAL_TABLET | Freq: Four times a day (QID) | ORAL | Status: DC | PRN

## 2018-08-09 MED ORDER — FENTANYL CITRATE (PF) 100 MCG/2ML IJ SOLN: 25.0000 ug | INTRAMUSCULAR | Status: DC | PRN

## 2018-08-09 MED ORDER — TRAMADOL HCL 50 MG PO TABS
50.0000 mg | ORAL_TABLET | Freq: Four times a day (QID) | ORAL | Status: DC | PRN
  Administered 2018-08-10: 100 mg via ORAL
  Filled 2018-08-09: qty 2

## 2018-08-09 MED ORDER — DIVALPROEX SODIUM 250 MG PO DR TAB
500.0000 mg | DELAYED_RELEASE_TABLET | Freq: Every morning | ORAL | Status: DC
  Administered 2018-08-09 – 2018-08-10 (×2): 500 mg via ORAL
  Filled 2018-08-09 (×2): qty 2

## 2018-08-09 MED ORDER — ACETAMINOPHEN 10 MG/ML IV SOLN
1000.0000 mg | Freq: Four times a day (QID) | INTRAVENOUS | Status: DC
  Administered 2018-08-09: 1000 mg via INTRAVENOUS
  Filled 2018-08-09: qty 100

## 2018-08-09 MED ORDER — MIDAZOLAM HCL 2 MG/2ML IJ SOLN
INTRAMUSCULAR | Status: AC
  Filled 2018-08-09: qty 2

## 2018-08-09 MED ORDER — DEXAMETHASONE SODIUM PHOSPHATE 10 MG/ML IJ SOLN: 8.0000 mg | Freq: Once | INTRAMUSCULAR | Status: DC

## 2018-08-09 MED ORDER — STERILE WATER FOR IRRIGATION IR SOLN
Status: DC | PRN
  Administered 2018-08-09: 2000 mL

## 2018-08-09 MED ORDER — PROPOFOL 500 MG/50ML IV EMUL
INTRAVENOUS | Status: DC | PRN
  Administered 2018-08-09: 125 ug/kg/min via INTRAVENOUS

## 2018-08-09 MED ORDER — BUPIVACAINE-EPINEPHRINE (PF) 0.25% -1:200000 IJ SOLN
INTRAMUSCULAR | Status: AC
  Filled 2018-08-09: qty 30

## 2018-08-09 MED ORDER — BUPIVACAINE-EPINEPHRINE (PF) 0.25% -1:200000 IJ SOLN
INTRAMUSCULAR | Status: DC | PRN
  Administered 2018-08-09: 30 mL

## 2018-08-09 MED ORDER — PANTOPRAZOLE SODIUM 40 MG PO TBEC
40.0000 mg | DELAYED_RELEASE_TABLET | Freq: Every day | ORAL | Status: DC
  Administered 2018-08-10: 40 mg via ORAL
  Filled 2018-08-09: qty 1

## 2018-08-09 MED ORDER — CHLORHEXIDINE GLUCONATE 4 % EX LIQD: 60.0000 mL | Freq: Once | CUTANEOUS | Status: DC

## 2018-08-09 MED ORDER — MENTHOL 3 MG MT LOZG: 1.0000 | LOZENGE | OROMUCOSAL | Status: DC | PRN

## 2018-08-09 MED ORDER — PHENYLEPHRINE 40 MCG/ML (10ML) SYRINGE FOR IV PUSH (FOR BLOOD PRESSURE SUPPORT)
PREFILLED_SYRINGE | INTRAVENOUS | Status: DC | PRN
  Administered 2018-08-09 (×2): 80 ug via INTRAVENOUS

## 2018-08-09 MED ORDER — TRANEXAMIC ACID-NACL 1000-0.7 MG/100ML-% IV SOLN
1000.0000 mg | Freq: Once | INTRAVENOUS | Status: AC
  Administered 2018-08-09: 1000 mg via INTRAVENOUS
  Filled 2018-08-09: qty 100

## 2018-08-09 MED ORDER — POLYETHYLENE GLYCOL 3350 17 G PO PACK: 17.0000 g | PACK | Freq: Every day | ORAL | Status: DC | PRN

## 2018-08-09 MED ORDER — ACETAMINOPHEN 160 MG/5ML PO SOLN: 1000.0000 mg | Freq: Once | ORAL | Status: DC | PRN

## 2018-08-09 MED ORDER — DIPHENHYDRAMINE HCL 12.5 MG/5ML PO ELIX: 12.5000 mg | ORAL_SOLUTION | ORAL | Status: DC | PRN

## 2018-08-09 MED ORDER — HYDROCODONE-ACETAMINOPHEN 5-325 MG PO TABS
1.0000 | ORAL_TABLET | ORAL | Status: DC | PRN
  Administered 2018-08-09: 2 via ORAL
  Administered 2018-08-09 – 2018-08-10 (×3): 1 via ORAL
  Administered 2018-08-10: 2 via ORAL
  Filled 2018-08-09: qty 1
  Filled 2018-08-09: qty 2
  Filled 2018-08-09 (×2): qty 1
  Filled 2018-08-09 (×2): qty 2

## 2018-08-09 MED ORDER — PHENOL 1.4 % MT LIQD
1.0000 | OROMUCOSAL | Status: DC | PRN
  Filled 2018-08-09: qty 177

## 2018-08-09 MED ORDER — DOCUSATE SODIUM 100 MG PO CAPS
100.0000 mg | ORAL_CAPSULE | Freq: Two times a day (BID) | ORAL | Status: DC
  Administered 2018-08-09 – 2018-08-10 (×2): 100 mg via ORAL
  Filled 2018-08-09 (×2): qty 1

## 2018-08-09 MED ORDER — ACETAMINOPHEN 500 MG PO TABS
500.0000 mg | ORAL_TABLET | Freq: Four times a day (QID) | ORAL | Status: DC
  Administered 2018-08-09 – 2018-08-10 (×3): 500 mg via ORAL
  Filled 2018-08-09 (×4): qty 1

## 2018-08-09 MED ORDER — BUPIVACAINE IN DEXTROSE 0.75-8.25 % IT SOLN
INTRATHECAL | Status: DC | PRN
  Administered 2018-08-09: 1.8 mL via INTRATHECAL

## 2018-08-09 MED ORDER — SODIUM CHLORIDE 0.9 % IV SOLN
INTRAVENOUS | Status: DC
  Administered 2018-08-09: 75 mL/h via INTRAVENOUS

## 2018-08-09 MED ORDER — CEFAZOLIN SODIUM-DEXTROSE 1-4 GM/50ML-% IV SOLN
1.0000 g | Freq: Four times a day (QID) | INTRAVENOUS | Status: AC
  Administered 2018-08-09 (×2): 1 g via INTRAVENOUS
  Filled 2018-08-09 (×2): qty 50

## 2018-08-09 MED ORDER — OXYCODONE HCL 5 MG/5ML PO SOLN
5.0000 mg | Freq: Once | ORAL | Status: DC | PRN
  Filled 2018-08-09: qty 5

## 2018-08-09 MED ORDER — 0.9 % SODIUM CHLORIDE (POUR BTL) OPTIME
TOPICAL | Status: DC | PRN
  Administered 2018-08-09: 1000 mL

## 2018-08-09 MED ORDER — PROPOFOL 10 MG/ML IV BOLUS
INTRAVENOUS | Status: AC
  Filled 2018-08-09: qty 60

## 2018-08-09 MED ORDER — PROPOFOL 10 MG/ML IV BOLUS
INTRAVENOUS | Status: AC
  Filled 2018-08-09: qty 80

## 2018-08-09 MED ORDER — MIDAZOLAM HCL 5 MG/5ML IJ SOLN
INTRAMUSCULAR | Status: DC | PRN
  Administered 2018-08-09: 2 mg via INTRAVENOUS

## 2018-08-09 MED ORDER — ACETAMINOPHEN 500 MG PO TABS: 1000.0000 mg | ORAL_TABLET | Freq: Once | ORAL | Status: DC | PRN

## 2018-08-09 MED ORDER — ACETAMINOPHEN 10 MG/ML IV SOLN: 1000.0000 mg | Freq: Once | INTRAVENOUS | Status: DC | PRN

## 2018-08-09 MED ORDER — PHENYLEPHRINE HCL 10 MG/ML IJ SOLN
INTRAMUSCULAR | Status: AC
  Filled 2018-08-09: qty 1

## 2018-08-09 MED ORDER — LACTATED RINGERS IV SOLN
INTRAVENOUS | Status: DC
  Administered 2018-08-09 (×2): via INTRAVENOUS

## 2018-08-09 MED ORDER — DEXAMETHASONE SODIUM PHOSPHATE 10 MG/ML IJ SOLN
10.0000 mg | Freq: Once | INTRAMUSCULAR | Status: AC
  Administered 2018-08-10: 10 mg via INTRAVENOUS
  Filled 2018-08-09: qty 1

## 2018-08-09 MED ORDER — METOCLOPRAMIDE HCL 5 MG PO TABS: 5.0000 mg | ORAL_TABLET | Freq: Three times a day (TID) | ORAL | Status: DC | PRN

## 2018-08-09 MED ORDER — CYCLOBENZAPRINE HCL 10 MG PO TABS
10.0000 mg | ORAL_TABLET | Freq: Three times a day (TID) | ORAL | Status: DC | PRN
  Administered 2018-08-09 – 2018-08-10 (×3): 10 mg via ORAL
  Filled 2018-08-09 (×3): qty 1

## 2018-08-09 SURGICAL SUPPLY — 44 items
BAG DECANTER FOR FLEXI CONT (MISCELLANEOUS) ×3 IMPLANT
BAG ZIPLOCK 12X15 (MISCELLANEOUS) IMPLANT
BALL HIP CERAMIC (Hips) IMPLANT
BLADE SAG 18X100X1.27 (BLADE) ×3 IMPLANT
BLADE SURG SZ10 CARB STEEL (BLADE) ×6 IMPLANT
CLOSURE WOUND 1/2 X4 (GAUZE/BANDAGES/DRESSINGS) ×1
COVER PERINEAL POST (MISCELLANEOUS) ×3 IMPLANT
COVER SURGICAL LIGHT HANDLE (MISCELLANEOUS) ×3 IMPLANT
COVER WAND RF STERILE (DRAPES) ×2 IMPLANT
CUP ACET PINNACLE SECTR 50MM (Hips) IMPLANT
DECANTER SPIKE VIAL GLASS SM (MISCELLANEOUS) ×3 IMPLANT
DRAPE STERI IOBAN 125X83 (DRAPES) ×3 IMPLANT
DRAPE U-SHAPE 47X51 STRL (DRAPES) ×6 IMPLANT
DRSG ADAPTIC 3X8 NADH LF (GAUZE/BANDAGES/DRESSINGS) ×3 IMPLANT
DRSG MEPILEX BORDER 4X4 (GAUZE/BANDAGES/DRESSINGS) ×3 IMPLANT
DRSG MEPILEX BORDER 4X8 (GAUZE/BANDAGES/DRESSINGS) ×3 IMPLANT
DURAPREP 26ML APPLICATOR (WOUND CARE) ×3 IMPLANT
ELECT REM PT RETURN 15FT ADLT (MISCELLANEOUS) ×3 IMPLANT
EVACUATOR 1/8 PVC DRAIN (DRAIN) ×3 IMPLANT
GLOVE BIO SURGEON STRL SZ7 (GLOVE) ×3 IMPLANT
GLOVE BIO SURGEON STRL SZ8 (GLOVE) ×3 IMPLANT
GLOVE BIOGEL PI IND STRL 7.0 (GLOVE) ×1 IMPLANT
GLOVE BIOGEL PI IND STRL 8 (GLOVE) ×1 IMPLANT
GLOVE BIOGEL PI INDICATOR 7.0 (GLOVE) ×2
GLOVE BIOGEL PI INDICATOR 8 (GLOVE) ×2
GOWN STRL REUS W/TWL LRG LVL3 (GOWN DISPOSABLE) ×3 IMPLANT
GOWN STRL REUS W/TWL XL LVL3 (GOWN DISPOSABLE) ×3 IMPLANT
HIP BALL CERAMIC (Hips) ×3 IMPLANT
HOLDER FOLEY CATH W/STRAP (MISCELLANEOUS) ×3 IMPLANT
LINER MARATHON 32 50 (Hips) ×2 IMPLANT
MANIFOLD NEPTUNE II (INSTRUMENTS) ×3 IMPLANT
PACK ANTERIOR HIP CUSTOM (KITS) ×3 IMPLANT
PINNACLE SECTOR CUP 50MM (Hips) ×3 IMPLANT
STEM FEM SZ3 STD ACTIS (Stem) ×2 IMPLANT
STRIP CLOSURE SKIN 1/2X4 (GAUZE/BANDAGES/DRESSINGS) ×2 IMPLANT
SUT ETHIBOND NAB CT1 #1 30IN (SUTURE) ×3 IMPLANT
SUT MNCRL AB 4-0 PS2 18 (SUTURE) ×3 IMPLANT
SUT STRATAFIX 0 PDS 27 VIOLET (SUTURE) ×3
SUT VIC AB 2-0 CT1 27 (SUTURE) ×4
SUT VIC AB 2-0 CT1 TAPERPNT 27 (SUTURE) ×2 IMPLANT
SUTURE STRATFX 0 PDS 27 VIOLET (SUTURE) ×1 IMPLANT
SYR 50ML LL SCALE MARK (SYRINGE) IMPLANT
TRAY FOLEY MTR SLVR 16FR STAT (SET/KITS/TRAYS/PACK) ×3 IMPLANT
YANKAUER SUCT BULB TIP 10FT TU (MISCELLANEOUS) ×3 IMPLANT

## 2018-08-09 NOTE — Interval H&P Note (Signed)
History and Physical Interval Note:  08/09/2018 7:37 AM  Rita Watson  has presented today for surgery, with the diagnosis of right hip osteoarthritis  The various methods of treatment have been discussed with the patient and family. After consideration of risks, benefits and other options for treatment, the patient has consented to  Procedure(s) with comments: RIGHT TOTAL HIP ARTHROPLASTY ANTERIOR APPROACH (Right) - as a surgical intervention .  The patient's history has been reviewed, patient examined, no change in status, stable for surgery.  I have reviewed the patient's chart and labs.  Questions were answered to the patient's satisfaction.     Homero Fellers Nevah Dalal

## 2018-08-09 NOTE — Transfer of Care (Signed)
Immediate Anesthesia Transfer of Care Note  Patient: Rita Watson  Procedure(s) Performed: RIGHT TOTAL HIP ARTHROPLASTY ANTERIOR APPROACH (Right Hip)  Patient Location: PACU  Anesthesia Type:Spinal  Level of Consciousness: awake and drowsy  Airway & Oxygen Therapy: Patient Spontanous Breathing and Patient connected to nasal cannula oxygen  Post-op Assessment: Report given to RN and Post -op Vital signs reviewed and stable  Post vital signs: Reviewed and stable  Last Vitals:  Vitals Value Taken Time  BP    Temp    Pulse    Resp    SpO2      Last Pain:  Vitals:   08/09/18 0701  TempSrc: Oral  PainSc:       Patients Stated Pain Goal: 7 (08/09/18 0654)  Complications: No apparent anesthesia complications

## 2018-08-09 NOTE — Anesthesia Preprocedure Evaluation (Addendum)
Anesthesia Evaluation  Patient identified by MRN, date of birth, ID band Patient awake    Reviewed: Allergy & Precautions, NPO status , Patient's Chart, lab work & pertinent test results  History of Anesthesia Complications (+) PONV and history of anesthetic complications  Airway Mallampati: II  TM Distance: >3 FB Neck ROM: Full    Dental  (+) Teeth Intact   Pulmonary neg pulmonary ROS,    breath sounds clear to auscultation       Cardiovascular negative cardio ROS   Rhythm:Regular     Neuro/Psych Seizures -, Well Controlled,  PSYCHIATRIC DISORDERS Anxiety    GI/Hepatic negative GI ROS,   Endo/Other  negative endocrine ROS  Renal/GU negative Renal ROS     Musculoskeletal  (+) Arthritis ,   Abdominal   Peds  Hematology negative hematology ROS (+)   Anesthesia Other Findings   Reproductive/Obstetrics                            Anesthesia Physical Anesthesia Plan  ASA: II  Anesthesia Plan: MAC and Spinal   Post-op Pain Management:    Induction:   PONV Risk Score and Plan: 2 and Propofol infusion and Treatment may vary due to age or medical condition  Airway Management Planned: Nasal Cannula  Additional Equipment: None  Intra-op Plan:   Post-operative Plan:   Informed Consent: I have reviewed the patients History and Physical, chart, labs and discussed the procedure including the risks, benefits and alternatives for the proposed anesthesia with the patient or authorized representative who has indicated his/her understanding and acceptance.   Dental advisory given  Plan Discussed with: CRNA and Surgeon  Anesthesia Plan Comments:         Anesthesia Quick Evaluation

## 2018-08-09 NOTE — Anesthesia Procedure Notes (Signed)
Spinal  Patient location during procedure: OR Start time: 08/09/2018 8:24 AM End time: 08/09/2018 8:28 AM Staffing Anesthesiologist: Val Eagle, MD Performed: anesthesiologist  Preanesthetic Checklist Completed: patient identified, surgical consent, pre-op evaluation, timeout performed, IV checked, risks and benefits discussed and monitors and equipment checked Spinal Block Patient position: sitting Prep: DuraPrep Patient monitoring: heart rate, cardiac monitor, continuous pulse ox and blood pressure Approach: midline Location: L4-5 Injection technique: single-shot Needle Needle type: Pencan  Needle gauge: 24 G Needle length: 9 cm Assessment Sensory level: T6

## 2018-08-09 NOTE — Op Note (Signed)
OPERATIVE REPORT- TOTAL HIP ARTHROPLASTY   PREOPERATIVE DIAGNOSIS: Osteoarthritis of the Right hip.   POSTOPERATIVE DIAGNOSIS: Osteoarthritis of the Right  hip.   PROCEDURE: Right total hip arthroplasty, anterior approach.   SURGEON: Ollen Gross, MD   ASSISTANT: Dennie Bible, PA-C  ANESTHESIA:  Spinal  ESTIMATED BLOOD LOSS:-350 mL    DRAINS: Hemovac x1.   COMPLICATIONS: None   CONDITION: PACU - hemodynamically stable.   BRIEF CLINICAL NOTE: Rita Watson is a 63 y.o. female who has advanced end-  stage arthritis of their Right  hip with progressively worsening pain and  dysfunction.The patient has failed nonoperative management and presents for  total hip arthroplasty.   PROCEDURE IN DETAIL: After successful administration of spinal  anesthetic, the traction boots for the [facility] bed were placed on both  feet and the patient was placed onto the [facility] bed, boots placed into the leg  holders. The Right hip was then isolated from the perineum with plastic  drapes and prepped and draped in the usual sterile fashion. ASIS and  greater trochanter were marked and a oblique incision was made, starting  at about 1 cm lateral and 2 cm distal to the ASIS and coursing towards  the anterior cortex of the femur. The skin was cut with a 10 blade  through subcutaneous tissue to the level of the fascia overlying the  tensor fascia lata muscle. The fascia was then incised in line with the  incision at the junction of the anterior third and posterior 2/3rd. The  muscle was teased off the fascia and then the interval between the TFL  and the rectus was developed. The Hohmann retractor was then placed at  the top of the femoral neck over the capsule. The vessels overlying the  capsule were cauterized and the fat on top of the capsule was removed.  A Hohmann retractor was then placed anterior underneath the rectus  femoris to give exposure to the entire anterior capsule. A T-shaped   capsulotomy was performed. The edges were tagged and the femoral head  was identified.       Osteophytes are removed off the superior acetabulum.  The femoral neck was then cut in situ with an oscillating saw. Traction  was then applied to the left lower extremity utilizing the [facility]  traction. The femoral head was then removed. Retractors were placed  around the acetabulum and then circumferential removal of the labrum was  performed. Osteophytes were also removed. Reaming starts at 47 mm to  medialize and  Increased in 2 mm increments to 49 mm. We reamed in  approximately 40 degrees of abduction, 20 degrees anteversion. A 50 mm  pinnacle acetabular shell was then impacted in anatomic position under  fluoroscopic guidance with excellent purchase. We did not need to place  any additional dome screws. A 32 mm neutral + 4 marathon liner was then  placed into the acetabular shell.       The femoral lift was then placed along the lateral aspect of the femur  just distal to the vastus ridge. The leg was  externally rotated and capsule  was stripped off the inferior aspect of the femoral neck down to the  level of the lesser trochanter, this was done with electrocautery. The femur was lifted after this was performed. The  leg was then placed in an extended and adducted position essentially delivering the femur. We also removed the capsule superiorly and the piriformis from the piriformis  fossa to gain excellent exposure of the  proximal femur. Rongeur was used to remove some cancellous bone to get  into the lateral portion of the proximal femur for placement of the  initial starter reamer. The starter broaches was placed  the starter broach  and was shown to go down the center of the canal. Broaching  with the Actis system was then performed starting at size 0  coursing  Up to size 3. A size 3 had excellent torsional and rotational  and axial stability. The trial standard offset neck was then  placed  with a 32 + 5 trial head. The hip was then reduced. We confirmed that  the stem was in the canal both on AP and lateral x-rays. It also has excellent sizing. The hip was reduced with outstanding stability through full extension and full external rotation.. AP pelvis was taken and the leg lengths were measured and found to be equal. Hip was then dislocated again and the femoral head and neck removed. The  femoral broach was removed. Size 3 Actis stem with a standard offset  neck was then impacted into the femur following native anteversion. Has  excellent purchase in the canal. Excellent torsional and rotational and  axial stability. It is confirmed to be in the canal on AP and lateral  fluoroscopic views. The 32 + 1 ceramic head was placed and the hip  reduced with outstanding stability. Again AP pelvis was taken and it  confirmed that the leg lengths were equal. The wound was then copiously  irrigated with saline solution and the capsule reattached and repaired  with Ethibond suture. 30 ml of .25% Bupivicaine was  injected into the capsule and into the edge of the tensor fascia lata as well as subcutaneous tissue. The fascia overlying the tensor fascia lata was then closed with a running #1 V-Loc. Subcu was closed with interrupted 2-0 Vicryl and subcuticular running 4-0 Monocryl. Incision was cleaned  and dried. Steri-Strips and a bulky sterile dressing applied. Hemovac  drain was hooked to suction and then the patient was awakened and transported to  recovery in stable condition.        Please note that a surgical assistant was a medical necessity for this procedure to perform it in a safe and expeditious manner. Assistant was necessary to provide appropriate retraction of vital neurovascular structures and to prevent femoral fracture and allow for anatomic placement of the prosthesis.  Ollen GrossFrank Jillana Selph, M.D.

## 2018-08-09 NOTE — Evaluation (Signed)
Physical Therapy Evaluation Patient Details Name: Rita Watson MRN: [id number] DOB: 02/06/1956 Today's Date: 08/09/2018   History of Present Illness  63 yo female s/p R DA-THA on 08/09/18. 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    Clinical Impression  Pt presents with R hip pain, back pain (present PTA), difficulty performing bed mobility and transfers, and decreased tolerance for ambulation due to pain. Pt to benefit from acute PT to address deficits. Pt ambulated 25 ft with RW with min guard assist, limited by antalgic gait. Pt educated on ankle pumps (20/hour) to perform this afternoon/evening to increase circulation, to pt's tolerance and limited by pain. PT to progress mobility as tolerated, and will continue to follow acutely.      Follow Up Recommendations Follow surgeon's recommendation for DC plan and follow-up therapies;Supervision for mobility/OOB(HEP)    Equipment Recommendations  None recommended by PT    Recommendations for Other Services       Precautions / Restrictions Precautions Precautions: Fall Restrictions Weight Bearing Restrictions: No Other Position/Activity Restrictions: WBAT       Mobility  Bed Mobility Overal bed mobility: Needs Assistance Bed Mobility: Supine to Sit;Sit to Supine     Supine to sit: Min assist;HOB elevated Sit to supine: Mod assist;HOB elevated   General bed mobility comments: Min assist for supine to sit for RLE lifting and translation, scooting to EOB. Mod assist for sit to supine for LE elevation into bed, and scooting pt up in bed with use of bed pad. Increased time and effort to perform.  Transfers Overall transfer level: Needs assistance Equipment used: Rolling walker (2 wheeled) Transfers: Sit to/from Stand Sit to Stand: Min assist;From elevated surface         General transfer comment: Min assist for power up and steadying immediately after standing. Verbal cuing for  technique and hand placement.   Ambulation/Gait Ambulation/Gait assistance: Min guard Gait Distance (Feet): 25 Feet Assistive device: Rolling walker (2 wheeled) Gait Pattern/deviations: Step-to pattern;Decreased stance time - right;Decreased weight shift to right;Antalgic Gait velocity: decr    General Gait Details: Min guard for safety. Verbal cuing for sequencing, placement in RW, turning. Pt with increased time and effort to ambulate, antalgic gait limiting distance.   Stairs            Wheelchair Mobility    Modified Rankin (Stroke Patients Only)       Balance Overall balance assessment: Mild deficits observed, not formally tested                                           Pertinent Vitals/Pain Pain Assessment: 0-10 Pain Score: 5  Pain Location: R hip, back  Pain Descriptors / Indicators: Throbbing Pain Intervention(s): Limited activity within patient's tolerance;Repositioned;Ice applied;Monitored during session;Premedicated before session    Home Living Family/patient expects to be discharged to:: Private residence Living Arrangements: Spouse/significant other;Children Available Help at Discharge: Family Type of Home: House Home Access: Stairs to enter Entrance Stairs-Rails: None Entrance Stairs-Number of Steps: 3(also has back steps with 4 steps with R handrail, will most likely use front steps) Home Layout: One level Home Equipment: Walker - 2 wheels;Cane - single point      Prior Function Level of Independence: Independent               Hand Dominance   Dominant  Hand: Right    Extremity/Trunk Assessment   Upper Extremity Assessment Upper Extremity Assessment: Overall WFL for tasks assessed    Lower Extremity Assessment Lower Extremity Assessment: Overall WFL for tasks assessed;RLE deficits/detail RLE Deficits / Details: suspected post-surgical weakness; able to perform ankle pumps, quad set, SLR with mod lift assist, heel  slide with assist RLE Sensation: WNL    Cervical / Trunk Assessment Cervical / Trunk Assessment: Normal  Communication   Communication: No difficulties  Cognition Arousal/Alertness: Awake/alert Behavior During Therapy: WFL for tasks assessed/performed Overall Cognitive Status: Within Functional Limits for tasks assessed                                 General Comments: Pt slightly drowsy, suspect due to medication       General Comments      Exercises     Assessment/Plan    PT Assessment Patient needs continued PT services  PT Problem List Decreased strength;Pain;Decreased activity tolerance;Decreased knowledge of use of DME;Decreased balance;Decreased mobility       PT Treatment Interventions DME instruction;Therapeutic activities;Gait training;Therapeutic exercise;Patient/family education;Stair training;Balance training;Functional mobility training    PT Goals (Current goals can be found in the Care Plan section)  Acute Rehab PT Goals Patient Stated Goal: decrease R hip and back pain  PT Goal Formulation: With patient Time For Goal Achievement: 08/16/18 Potential to Achieve Goals: Good    Frequency 7X/week   Barriers to discharge        Co-evaluation               AM-PAC PT "6 Clicks" Mobility  Outcome Measure Help needed turning from your back to your side while in a flat bed without using bedrails?: A Little Help needed moving from lying on your back to sitting on the side of a flat bed without using bedrails?: A Little Help needed moving to and from a bed to a chair (including a wheelchair)?: A Little Help needed standing up from a chair using your arms (e.g., wheelchair or bedside chair)?: A Little Help needed to walk in hospital room?: A Little Help needed climbing 3-5 steps with a railing? : A Little 6 Click Score: 18    End of Session Equipment Utilized During Treatment: Gait belt Activity Tolerance: Patient limited by  pain;Patient limited by fatigue Patient left: in bed;with bed alarm set;with call bell/phone within reach;with SCD's reapplied Nurse Communication: Mobility status PT Visit Diagnosis: Other abnormalities of gait and mobility (R26.89);Difficulty in walking, not elsewhere classified (R26.2)    Time: [id number] PT Time Calculation (min) (ACUTE ONLY): 27 min   Charges:   PT Evaluation $PT Eval Low Complexity: 1 Low PT Treatments $Gait Training: 8-22 mins       Nicola PoliceAlexa D Tavie Haseman, PT Acute Rehabilitation Services Pager [phone]  Office [phone]  Carmen Tolliver D Despina Hiddenure 08/09/2018, 3:22 PM

## 2018-08-09 NOTE — Plan of Care (Signed)
Plan of care 

## 2018-08-09 NOTE — Discharge Instructions (Signed)
°Dr. Frank Aluisio °Total Joint Specialist °[facility] °[address] °[place],  [address] °[phone] ° °ANTERIOR APPROACH TOTAL HIP REPLACEMENT POSTOPERATIVE DIRECTIONS ° ° °Hip Rehabilitation, Guidelines Following Surgery  °The results of a hip operation are greatly improved after range of motion and muscle strengthening exercises. Follow all safety measures which are given to protect your hip. If any of these exercises cause increased pain or swelling in your joint, decrease the amount until you are comfortable again. Then slowly increase the exercises. Call your caregiver if you have problems or questions.  ° °HOME CARE INSTRUCTIONS  °• Remove items at home which could result in a fall. This includes throw rugs or furniture in walking pathways.  °· ICE to the affected hip every three hours for 30 minutes at a time and then as needed for pain and swelling.  Continue to use ice on the hip for pain and swelling from surgery. You may notice swelling that will progress down to the foot and ankle.  This is normal after surgery.  Elevate the leg when you are not up walking on it.   °· Continue to use the breathing machine which will help keep your temperature down.  It is common for your temperature to cycle up and down following surgery, especially at night when you are not up moving around and exerting yourself.  The breathing machine keeps your lungs expanded and your temperature down. ° °DIET °You may resume your previous home diet once your are discharged from the hospital. ° °DRESSING / WOUND CARE / SHOWERING °You may shower 3 days after surgery, but keep the wounds dry during showering.  You may use an occlusive plastic wrap (Press'n Seal for example), NO SOAKING/SUBMERGING IN THE BATHTUB.  If the bandage gets wet, change with a clean dry gauze.  If the incision gets wet, pat the wound dry with a clean towel. °You may start showering once you are discharged home but do not submerge the  incision under water. Just pat the incision dry and apply a dry gauze dressing on daily. °Change the surgical dressing daily and reapply a dry dressing each time. ° °ACTIVITY °Walk with your walker as instructed. °Use walker as long as suggested by your caregivers. °Avoid periods of inactivity such as sitting longer than an hour when not asleep. This helps prevent blood clots.  °You may resume a sexual relationship in one month or when given the OK by your doctor.  °You may return to work once you are cleared by your doctor.  °Do not drive a car for 6 weeks or until released by you surgeon.  °Do not drive while taking narcotics. ° °WEIGHT BEARING °Weight bearing as tolerated with assist device (walker, cane, etc) as directed, use it as long as suggested by your surgeon or therapist, typically at least 4-6 weeks. ° °POSTOPERATIVE CONSTIPATION PROTOCOL °Constipation - defined medically as fewer than three stools per week and severe constipation as less than one stool per week. ° °One of the most common issues patients have following surgery is constipation.  Even if you have a regular bowel pattern at home, your normal regimen is likely to be disrupted due to multiple reasons following surgery.  Combination of anesthesia, postoperative narcotics, change in appetite and fluid intake all can affect your bowels.  In order to avoid complications following surgery, here are some recommendations in order to help you during your recovery period. ° °Colace (docusate) - Pick up an over-the-counter form   of Colace or another stool softener and take twice a day as long as you are requiring postoperative pain medications.  Take with a full glass of water daily.  If you experience loose stools or diarrhea, hold the colace until you stool forms back up.  If your symptoms do not get better within 1 week or if they get worse, check with your doctor. ° °Dulcolax (bisacodyl) - Pick up over-the-counter and take as directed by the product  packaging as needed to assist with the movement of your bowels.  Take with a full glass of water.  Use this product as needed if not relieved by Colace only.  ° °MiraLax (polyethylene glycol) - Pick up over-the-counter to have on hand.  MiraLax is a solution that will increase the amount of water in your bowels to assist with bowel movements.  Take as directed and can mix with a glass of water, juice, soda, coffee, or tea.  Take if you go more than two days without a movement. °Do not use MiraLax more than once per day. Call your doctor if you are still constipated or irregular after using this medication for 7 days in a row. ° °If you continue to have problems with postoperative constipation, please contact the office for further assistance and recommendations.  If you experience "the worst abdominal pain ever" or develop nausea or vomiting, please contact the office immediatly for further recommendations for treatment. ° °ITCHING ° If you experience itching with your medications, try taking only a single pain pill, or even half a pain pill at a time.  You can also use Benadryl over the counter for itching or also to help with sleep.  ° °TED HOSE STOCKINGS °Wear the elastic stockings on both legs for three weeks following surgery during the day but you may remove then at night for sleeping. ° °MEDICATIONS °See your medication summary on the “After Visit Summary” that the nursing staff will review with you prior to discharge.  You may have some home medications which will be placed on hold until you complete the course of blood thinner medication.  It is important for you to complete the blood thinner medication as prescribed by your surgeon.  Continue your approved medications as instructed at time of discharge. ° °PRECAUTIONS °If you experience chest pain or shortness of breath - call 911 immediately for transfer to the hospital emergency department.  °If you develop a fever greater that 101 F, purulent drainage  from wound, increased redness or drainage from wound, foul odor from the wound/dressing, or calf pain - CONTACT YOUR SURGEON.   °                                                °FOLLOW-UP APPOINTMENTS °Make sure you keep all of your appointments after your operation with your surgeon and caregivers. You should call the office at the above phone number and make an appointment for approximately two weeks after the date of your surgery or on the date instructed by your surgeon outlined in the "After Visit Summary". ° °RANGE OF MOTION AND STRENGTHENING EXERCISES  °These exercises are designed to help you keep full movement of your hip joint. Follow your caregiver's or physical therapist's instructions. Perform all exercises about fifteen times, three times per day or as directed. Exercise both hips, even if you have   had only one joint replacement. These exercises can be done on a training (exercise) mat, on the floor, on a table or on a bed. Use whatever works the best and is most comfortable for you. Use music or television while you are exercising so that the exercises are a pleasant break in your day. This will make your life better with the exercises acting as a break in routine you can look forward to.  °• Lying on your back, slowly slide your foot toward your buttocks, raising your knee up off the floor. Then slowly slide your foot back down until your leg is straight again.  °• Lying on your back spread your legs as far apart as you can without causing discomfort.  °• Lying on your side, raise your upper leg and foot straight up from the floor as far as is comfortable. Slowly lower the leg and repeat.  °• Lying on your back, tighten up the muscle in the front of your thigh (quadriceps muscles). You can do this by keeping your leg straight and trying to raise your heel off the floor. This helps strengthen the largest muscle supporting your knee.  °• Lying on your back, tighten up the muscles of your buttocks both  with the legs straight and with the knee bent at a comfortable angle while keeping your heel on the floor.  ° °IF YOU ARE TRANSFERRED TO A SKILLED REHAB FACILITY °If the patient is transferred to a skilled rehab facility following release from the hospital, a list of the current medications will be sent to the facility for the patient to continue.  When discharged from the skilled rehab facility, please have the facility set up the patient's Home Health Physical Therapy prior to being released. Also, the skilled facility will be responsible for providing the patient with their medications at time of release from the facility to include their pain medication, the muscle relaxants, and their blood thinner medication. If the patient is still at the rehab facility at time of the two week follow up appointment, the skilled rehab facility will also need to assist the patient in arranging follow up appointment in our office and any transportation needs. ° °MAKE SURE YOU:  °• Understand these instructions.  °• Get help right away if you are not doing well or get worse.  ° ° °Pick up stool softner and laxative for home use following surgery while on pain medications. °Do not submerge incision under water. °Please use good hand washing techniques while changing dressing each day. °May shower starting three days after surgery. °Please use a clean towel to pat the incision dry following showers. °Continue to use ice for pain and swelling after surgery. °Do not use any lotions or creams on the incision until instructed by your surgeon. ° °

## 2018-08-10 ENCOUNTER — Encounter (HOSPITAL_COMMUNITY): Payer: Self-pay | Admitting: Orthopedic Surgery

## 2018-08-10 LAB — BASIC METABOLIC PANEL
Anion gap: 8 (ref 5–15)
BUN: 10 mg/dL (ref 8–23)
CHLORIDE: 105 mmol/L (ref 98–111)
CO2: 26 mmol/L (ref 22–32)
Calcium: 8.2 mg/dL — ABNORMAL LOW (ref 8.9–10.3)
Creatinine, Ser: 0.49 mg/dL (ref 0.44–1.00)
GFR calc Af Amer: >60 mL/min (ref >60)
GFR calc non Af Amer: >60 mL/min (ref >60)
Glucose, Bld: 107 mg/dL — ABNORMAL HIGH (ref 70–99)
POTASSIUM: 3.6 mmol/L (ref 3.5–5.1)
Sodium: 139 mmol/L (ref 135–145)

## 2018-08-10 LAB — CBC
HEMATOCRIT: 31 % — AB (ref 36.0–46.0)
HEMOGLOBIN: 9.9 g/dL — AB (ref 12.0–15.0)
MCH: 26.8 pg (ref 26.0–34.0)
MCHC: 31.9 g/dL (ref 30.0–36.0)
MCV: 83.8 fL (ref 80.0–100.0)
Platelets: 247 10*3/uL (ref 150–400)
RBC: 3.7 MIL/uL — ABNORMAL LOW (ref 3.87–5.11)
RDW: 12.7 % (ref 11.5–15.5)
WBC: 14.1 10*3/uL — ABNORMAL HIGH (ref 4.0–10.5)
nRBC: 0 % (ref 0.0–0.2)

## 2018-08-10 MED ORDER — HYDROCODONE-ACETAMINOPHEN 5-325 MG PO TABS: 1.0000 | ORAL_TABLET | Freq: Four times a day (QID) | ORAL | 0 refills | Status: DC | PRN

## 2018-08-10 MED ORDER — CYCLOBENZAPRINE HCL 10 MG PO TABS: 10.0000 mg | ORAL_TABLET | Freq: Three times a day (TID) | ORAL | 0 refills | Status: DC | PRN

## 2018-08-10 MED ORDER — ASPIRIN 325 MG PO TBEC: 325.0000 mg | DELAYED_RELEASE_TABLET | Freq: Two times a day (BID) | ORAL | 0 refills | Status: AC

## 2018-08-10 MED ORDER — TRAMADOL HCL 50 MG PO TABS: 50.0000 mg | ORAL_TABLET | Freq: Four times a day (QID) | ORAL | 0 refills | Status: DC | PRN

## 2018-08-10 NOTE — Progress Notes (Signed)
Physical Therapy Treatment Patient Details Name: Rita Pitcheratricia Watson Watson MRN: [id number] DOB: 30-Apr-1956 Today's Date: 08/10/2018    History of Present Illness 63 yo female s/p R DA-THA on 08/09/18. 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    PT Comments    The patient reports pain is decreased. Practiced steps. Ready for DC after urinates. RN informed.   Follow Up Recommendations  Follow surgeon's recommendation for DC plan and follow-up therapies;Supervision for mobility/OOB;No PT follow up     Equipment Recommendations  None recommended by PT    Recommendations for Other Services       Precautions / Restrictions Precautions Precautions: Fall    Mobility  Bed Mobility   Bed Mobility: Supine to Sit;Sit to Supine     Supine to sit: Min assist;HOB elevated     General bed mobility comments: OOB  Transfers Overall transfer level: Needs assistance Equipment used: Rolling walker (2 wheeled) Transfers: Sit to/from Stand Sit to Stand: Supervision         General transfer comment: steady assist to rise from bed and BSC/recliner, decreased hip flexion on the right . decreased conrol to descend  Ambulation/Gait Ambulation/Gait assistance: Min guard Gait Distance (Feet): 200 Feet Assistive device: Rolling walker (2 wheeled) Gait Pattern/deviations: Step-to pattern;Step-through pattern Gait velocity: decr    General Gait Details: improved sequence and gait pattern   Stairs Stairs: Yes Stairs assistance: Min assist Stair Management: Step to pattern;Backwards;With walker   General stair comments: practiced 2 and 3    Wheelchair Mobility    Modified Rankin (Stroke Patients Only)       Balance                                            Cognition Arousal/Alertness: Awake/alert                                            Exercises Rita [name] PT Acute Rehabilitation Services Pager  [phone] Office [phone]   General Comments        Pertinent Vitals/Pain Pain Score: 3  Pain Location: R hip,  Pain Descriptors / Indicators: Burning Pain Intervention(s): Monitored during session;Premedicated before session;Ice applied    Home Living                      Prior Function            PT Goals (current goals can now be found in the care plan section) Progress towards PT goals: Progressing toward goals    Frequency    7X/week      PT Plan Current plan remains appropriate    Co-evaluation              AM-PAC PT "6 Clicks" Mobility   Outcome Measure  Help needed turning from your back to your side while in a flat bed without using bedrails?: A Little Help needed moving from lying on your back to sitting on the side of a flat bed without using bedrails?: A Little Help needed moving to and from a bed to a chair (including a wheelchair)?: A Little Help needed standing up from a chair using your arms (e.g., wheelchair or bedside chair)?: A Little Help  needed to walk in hospital room?: A Little Help needed climbing 3-5 steps with a railing? : A Little 6 Click Score: 18    End of Session   Activity Tolerance: Patient tolerated treatment well Patient left: in chair;with call bell/phone within reach Nurse Communication: Mobility status PT Visit Diagnosis: Other abnormalities of gait and mobility (R26.89);Difficulty in walking, not elsewhere classified (R26.2)     Time: 4827-0786 PT Time Calculation (min) (ACUTE ONLY): 22 min  Charges:  $Gait Training: 8-22 mins $                   Rita Watson PT Acute Rehabilitation Services Pager [phone] Office [phone]    Rita Watson 08/10/2018, 12:04 PM

## 2018-08-10 NOTE — Progress Notes (Signed)
Physical Therapy Treatment Patient Details Name: Rita Watson MRN: [id number] DOB: May 27, 1956 Today's Date: 08/10/2018    History of Present Illness 63 yo female s/p R DA-THA on 08/09/18. 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    PT Comments    The patient reports pain 8/10. Medication given and patient able to ambulate x 100'. Plans DC after PM PT/ steps.   Follow Up Recommendations  Follow surgeon's recommendation for DC plan and follow-up therapies;Supervision for mobility/OOB;No PT follow up     Equipment Recommendations  None recommended by PT    Recommendations for Other Services       Precautions / Restrictions Precautions Precautions: Fall Restrictions Weight Bearing Restrictions: No    Mobility  Bed Mobility   Bed Mobility: Supine to Sit;Sit to Supine     Supine to sit: Min assist;HOB elevated     General bed mobility comments: used  belt around Right leg to self assit, required assist at terunk, patient " stiiff" in back  Transfers Overall transfer level: Needs assistance Equipment used: Rolling walker (2 wheeled) Transfers: Sit to/from Stand Sit to Stand: Min assist         General transfer comment: steady assist to rise from bed and BSC/recliner, decreased hip flexi90n on the right .  Ambulation/Gait Ambulation/Gait assistance: Min assist Gait Distance (Feet): 20 Feet(then 100) Assistive device: Rolling walker (2 wheeled) Gait Pattern/deviations: Step-to pattern;Step-through pattern;Antalgic;Decreased step length - right;Decreased stance time - right Gait velocity: decr    General Gait Details: cues to try foot flat, at times swings through and has no weight on R leg. Graduallly improved with sequence and weight bearing.   Stairs             Wheelchair Mobility    Modified Rankin (Stroke Patients Only)       Balance                                             Cognition Arousal/Alertness: Awake/alert                                            Exercises Total Joint Exercises Ankle Circles/Pumps: AROM;Both;10 reps;Supine Heel Slides: AAROM;Supine;Right;10 reps Hip ABduction/ADduction: AAROM;Supine;Right;10 reps Long Arc Quad: AAROM;Right;10 reps;Seated Marching in Standing: AAROM;Seated;Right;10 reps    General Comments        Pertinent Vitals/Pain Pain Score: 8  Pain Location: R hip,  Pain Descriptors / Indicators: Aching;Tightness Pain Intervention(s): Monitored during session;Limited activity within patient's tolerance;RN gave pain meds during session;Patient requesting pain meds-RN notified    Home Living                      Prior Function            PT Goals (current goals can now be found in the care plan section) Progress towards PT goals: Progressing toward goals    Frequency    7X/week      PT Plan Current plan remains appropriate    Co-evaluation              AM-PAC PT "6 Clicks" Mobility   Outcome Measure  Help needed turning from your back to your side while  in a flat bed without using bedrails?: A Little Help needed moving from lying on your back to sitting on the side of a flat bed without using bedrails?: A Little Help needed moving to and from a bed to a chair (including a wheelchair)?: A Little Help needed standing up from a chair using your arms (e.g., wheelchair or bedside chair)?: A Lot Help needed to walk in hospital room?: A Lot Help needed climbing 3-5 steps with a railing? : A Lot 6 Click Score: 15    End of Session   Activity Tolerance: Patient tolerated treatment well Patient left: in chair;with call bell/phone within reach Nurse Communication: Mobility status PT Visit Diagnosis: Other abnormalities of gait and mobility (R26.89);Difficulty in walking, not elsewhere classified (R26.2)     Time: 8315-1761 PT Time Calculation (min) (ACUTE ONLY): 53  min  Charges:  $Gait Training: 23-37 mins $Therapeutic Exercise: 8-22 mins $Self Care/Home Management: 8-22                     Blanchard Kelch PT Acute Rehabilitation Services Pager [phone] Office [phone]    Rada Hay 08/10/2018, 10:23 AM

## 2018-08-10 NOTE — Progress Notes (Signed)
Discharge paperwork discussed with pt at the bedside.  She demonstrated understanding.  She was escorted in stable condition to main lobby by wheelchair.

## 2018-08-10 NOTE — Care Management Note (Signed)
Case Management Note  Patient Details  Name: Rita Watson MRN: [id number] Date of Birth: [birth year]  Subjective/Objective:     Spoke with patient at bedside. Confirmed plan for HEP. Has RW and 3n1. [phone]               Action/Plan:   Expected Discharge Date:  08/10/18               Expected Discharge Plan:  Home/Self Care  In-House Referral:  NA  Discharge planning Services  CM Consult  Post Acute Care Choice:  NA Choice offered to:  Patient  DME Arranged:  N/A DME Agency:  NA  HH Arranged:  NA HH Agency:  NA  Status of Service:  Completed, signed off  If discussed at Long Length of Stay Meetings, dates discussed:    Additional Comments:  Alexis Goodell, RN 08/10/2018, 9:38 AM
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
## 2018-08-10 NOTE — Plan of Care (Signed)
  Problem: Clinical Measurements: Goal: Will remain free from infection Outcome: Progressing   Problem: Coping: Goal: Level of anxiety will decrease Outcome: Progressing   Problem: Pain Managment: Goal: General experience of comfort will improve Outcome: Progressing   

## 2018-08-10 NOTE — Plan of Care (Signed)
Pt alert and oriented, plan to d/c today per MD order.  Pain with movement, RN will monitor.

## 2018-08-11 ENCOUNTER — Encounter (HOSPITAL_COMMUNITY): Payer: Self-pay | Admitting: Orthopedic Surgery

## 2018-08-11 NOTE — Anesthesia Postprocedure Evaluation (Signed)
Anesthesia Post Note  Patient: Rita Watson  Procedure(s) Performed: RIGHT TOTAL HIP ARTHROPLASTY ANTERIOR APPROACH (Right Hip)     Patient location during evaluation: PACU Anesthesia Type: MAC and Spinal Level of consciousness: awake and alert Pain management: pain level controlled Vital Signs Assessment: post-procedure vital signs reviewed and stable Respiratory status: spontaneous breathing, nonlabored ventilation, respiratory function stable and patient connected to nasal cannula oxygen Cardiovascular status: stable and blood pressure returned to baseline Postop Assessment: no apparent nausea or vomiting and spinal receding Anesthetic complications: no    Last Vitals:  Vitals:   08/10/18 0605 08/10/18 1033  BP: 115/74 116/69  Pulse: 91 96  Resp: 16 15  Temp: 36.9 C 36.7 C  SpO2: 100% 94%    Last Pain:  Vitals:   08/10/18 1051  TempSrc:   PainSc: 0-No pain                 Masson Nalepa
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
## 2019-03-27 ENCOUNTER — Ambulatory Visit: Payer: Managed Care, Other (non HMO) | Admitting: Neurology

## 2019-03-29 ENCOUNTER — Encounter: Payer: Self-pay | Admitting: Neurology

## 2019-03-29 ENCOUNTER — Telehealth: Payer: Self-pay | Admitting: Neurology

## 2019-03-29 ENCOUNTER — Other Ambulatory Visit: Payer: Self-pay

## 2019-03-29 ENCOUNTER — Ambulatory Visit (INDEPENDENT_AMBULATORY_CARE_PROVIDER_SITE_OTHER): Payer: Managed Care, Other (non HMO) | Admitting: Neurology

## 2019-03-29 VITALS — BP 112/66 | HR 73 | Temp 97.9°F | Ht 66.0 in | Wt 155.5 lb

## 2019-03-29 DIAGNOSIS — G40309 Generalized idiopathic epilepsy and epileptic syndromes, not intractable, without status epilepticus: Secondary | ICD-10-CM

## 2019-03-29 MED ORDER — DIVALPROEX SODIUM ER 500 MG PO TB24: 1000.0000 mg | ORAL_TABLET | Freq: Every day | ORAL | 4 refills | Status: DC
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
## 2019-03-29 NOTE — Telephone Encounter (Signed)
[employer] order sent to [facility]. They will obtain the auth and reach out to the patient to schedule.  

## 2019-03-30 LAB — CBC WITH DIFFERENTIAL/PLATELET
Basophils Absolute: 0 10*3/uL (ref 0.0–0.2)
Basos: 1 %
EOS (ABSOLUTE): 0.2 10*3/uL (ref 0.0–0.4)
Eos: 4 %
Hematocrit: 35.8 % (ref 34.0–46.6)
Hemoglobin: 11.4 g/dL (ref 11.1–15.9)
Immature Grans (Abs): 0 10*3/uL (ref 0.0–0.1)
Immature Granulocytes: 0 %
Lymphocytes Absolute: 1.6 10*3/uL (ref 0.7–3.1)
Lymphs: 35 %
MCH: 23.9 pg — ABNORMAL LOW (ref 26.6–33.0)
MCHC: 31.8 g/dL (ref 31.5–35.7)
MCV: 75 fL — ABNORMAL LOW (ref 79–97)
Monocytes Absolute: 0.6 10*3/uL (ref 0.1–0.9)
Monocytes: 12 %
Neutrophils Absolute: 2.2 10*3/uL (ref 1.4–7.0)
Neutrophils: 48 %
Platelets: 270 10*3/uL (ref 150–450)
RBC: 4.77 x10E6/uL (ref 3.77–5.28)
RDW: 15.4 % (ref 11.7–15.4)
WBC: 4.6 10*3/uL (ref 3.4–10.8)

## 2019-03-30 LAB — COMPREHENSIVE METABOLIC PANEL
ALT: 8 IU/L (ref 0–32)
AST: 14 IU/L (ref 0–40)
Albumin/Globulin Ratio: 1.7 (ref 1.2–2.2)
Albumin: 4 g/dL (ref 3.8–4.8)
Alkaline Phosphatase: 80 IU/L (ref 39–117)
BUN/Creatinine Ratio: 25 (ref 12–28)
BUN: 13 mg/dL (ref 8–27)
Bilirubin Total: <0.2 mg/dL (ref 0.0–1.2)
CO2: 26 mmol/L (ref 20–29)
Calcium: 9.2 mg/dL (ref 8.7–10.3)
Chloride: 102 mmol/L (ref 96–106)
Creatinine, Ser: 0.52 mg/dL — ABNORMAL LOW (ref 0.57–1.00)
GFR calc Af Amer: 118 mL/min/{1.73_m2} (ref >59)
GFR calc non Af Amer: 103 mL/min/{1.73_m2} (ref >59)
Globulin, Total: 2.4 g/dL (ref 1.5–4.5)
Glucose: 86 mg/dL (ref 65–99)
Potassium: 4.4 mmol/L (ref 3.5–5.2)
Sodium: 140 mmol/L (ref 134–144)
Total Protein: 6.4 g/dL (ref 6.0–8.5)

## 2019-03-30 LAB — VALPROIC ACID LEVEL: Valproic Acid Lvl: 41 ug/mL — ABNORMAL LOW (ref 50–100)

## 2019-03-30 LAB — TSH: TSH: 2.99 u[IU]/mL (ref 0.450–4.500)

## 2019-03-30 LAB — VITAMIN D 25 HYDROXY (VIT D DEFICIENCY, FRACTURES): Vit D, 25-Hydroxy: 33.1 ng/mL (ref 30.0–100.0)

## 2019-04-02 ENCOUNTER — Telehealth: Payer: Self-pay | Admitting: Neurology

## 2019-04-02 NOTE — Telephone Encounter (Signed)
Laboratory evaluation showed no significant abnormality, Depakote level was 41,

## 2019-04-02 NOTE — Telephone Encounter (Signed)
I spoke to the patient and she verbalized understanding of her lab results. 

## 2019-04-18 ENCOUNTER — Ambulatory Visit (INDEPENDENT_AMBULATORY_CARE_PROVIDER_SITE_OTHER): Payer: Managed Care, Other (non HMO) | Admitting: Neurology

## 2019-04-18 ENCOUNTER — Other Ambulatory Visit: Payer: Self-pay

## 2019-04-18 DIAGNOSIS — G40309 Generalized idiopathic epilepsy and epileptic syndromes, not intractable, without status epilepticus: Secondary | ICD-10-CM

## 2019-04-19 NOTE — Procedures (Signed)
   HISTORY: 63 years old female, presented with seizure TECHNIQUE:  This is a routine 16 channel EEG recording with one channel devoted to a limited EKG recording.  It was performed during wakefulness, drowsiness and asleep.  Hyperventilation and photic stimulation were performed as activating procedures.  There are minimum muscle and movement artifact noted.  Upon maximum arousal, posterior dominant waking rhythm consistent of rhythmic alpha range activity, with frequency of 9 hz. Activities are symmetric over the bilateral posterior derivations and attenuated with eye opening.  Hyperventilation produced mild/moderate buildup with higher amplitude and the slower activities noted.  Photic stimulation did not alter the tracing.  During EEG recording, patient developed drowsiness and no deeper stage of sleep was achieved During EEG recording, there was no epileptiform discharge noted.  EKG demonstrate sinus rhythm, with heart rate of 72 bpm  CONCLUSION: This is a  normal awake EEG.  There is no electrodiagnostic evidence of epileptiform discharge.  Marcial Pacas, M.D. Ph.D.  [facility] [place], [place] [address] Phone: [phone] Fax:      [phone]

## 2019-05-01 NOTE — Telephone Encounter (Signed)
Novella Rob: [id number] (exp. 05/01/19 to 10/28/19) patient is scheduled at [facility] for 05/03/19.

## 2019-05-03 ENCOUNTER — Other Ambulatory Visit: Payer: Self-pay

## 2019-05-03 ENCOUNTER — Ambulatory Visit
Admission: RE | Admit: 2019-05-03 | Discharge: 2019-05-03 | Disposition: A | Discharge status: Home / Self Care | Payer: Managed Care, Other (non HMO) | Source: Ambulatory Visit | Attending: Neurology | Admitting: Neurology

## 2019-05-03 DIAGNOSIS — G40309 Generalized idiopathic epilepsy and epileptic syndromes, not intractable, without status epilepticus: Secondary | ICD-10-CM | POA: Diagnosis not present

## 2019-05-03 MED ORDER — GADOBENATE DIMEGLUMINE 529 MG/ML IV SOLN
15.0000 mL | Freq: Once | INTRAVENOUS | Status: AC | PRN
  Administered 2019-05-03: 09:00:00 15 mL via INTRAVENOUS

## 2019-05-07 ENCOUNTER — Telehealth: Payer: Self-pay | Admitting: Neurology

## 2019-05-07 NOTE — Telephone Encounter (Signed)
Left message requesting a return call.

## 2019-05-07 NOTE — Telephone Encounter (Signed)
I spoke to the patient and she is aware of MRI results.  She is also aware of her normal EEG.  She verbalized understanding to continue her medication as prescribed.  She will keep her pending follow up in December.   She received a bill for her labs from [facility].  They declined cover the vitamin D level.  I called [facility] [phone]) and provided them with updated ICD-10 codes.  They will re-file with her insurance and she may disregard the current bill.  She has been updated with this information.

## 2019-05-07 NOTE — Telephone Encounter (Addendum)
Left second message requesting a call back.  Her DPR provided permission to call her husband's number.  Also left message on his voicemail.

## 2019-05-07 NOTE — Telephone Encounter (Signed)
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  The brain is otherwise normal.    IMPRESSION: This MRI of the brain with and without contrast shows the following: 1.    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 2.    The brain is otherwise normal with without contrast 3.    No acute findings.

## 2019-05-08 NOTE — Telephone Encounter (Signed)
I received a request from [facility] this morning requesting updated codes to cover the following labs:  CBC, CMP, TSH, Vit D, VPA.   Updates provided and faxed back to them at [phone].  Confirmation received.

## 2019-07-18 ENCOUNTER — Encounter: Payer: Self-pay | Admitting: Neurology

## 2019-07-18 ENCOUNTER — Other Ambulatory Visit: Payer: Self-pay

## 2019-07-18 ENCOUNTER — Ambulatory Visit (INDEPENDENT_AMBULATORY_CARE_PROVIDER_SITE_OTHER): Payer: Managed Care, Other (non HMO) | Admitting: Neurology

## 2019-07-18 VITALS — BP 114/64 | HR 85 | Temp 97.7°F | Ht 66.0 in | Wt 163.4 lb

## 2019-07-18 DIAGNOSIS — G40309 Generalized idiopathic epilepsy and epileptic syndromes, not intractable, without status epilepticus: Secondary | ICD-10-CM
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
## 2019-07-18 NOTE — Patient Instructions (Signed)
Continue current medication   Call for recurrent seizure   We will see you in 6 months or sooner if needed

## 2019-07-23 ENCOUNTER — Ambulatory Visit: Payer: Managed Care, Other (non HMO) | Attending: Internal Medicine

## 2019-07-23 DIAGNOSIS — Z20822 Contact with and (suspected) exposure to covid-19: Secondary | ICD-10-CM

## 2019-07-24 LAB — NOVEL CORONAVIRUS, NAA: SARS-CoV-2, NAA: NOT DETECTED

## 2019-10-03 NOTE — Progress Notes (Signed)
I have reviewed and agreed above plan. 

## 2019-10-31 ENCOUNTER — Encounter: Payer: Self-pay | Admitting: Neurology

## 2019-11-01 ENCOUNTER — Ambulatory Visit: Payer: Managed Care, Other (non HMO) | Attending: Internal Medicine

## 2019-11-01 DIAGNOSIS — Z23 Encounter for immunization: Secondary | ICD-10-CM

## 2019-11-01 NOTE — Progress Notes (Signed)
   Covid-19 Vaccination Clinic  Name:  ELVENIA GODDEN    MRN: [id number] DOB: Feb 21, 1956  11/01/2019  Ms. Eckmann was observed post Covid-19 immunization for 15 minutes without incident. She was provided with Vaccine Information Sheet and instruction to access the V-Safe system.   Ms. Lemaster was instructed to call 911 with any severe reactions post vaccine: Marland Kitchen Difficulty breathing  . Swelling of face and throat  . A fast heartbeat  . A bad rash all over body  . Dizziness and weakness   Immunizations Administered    Name Date Dose VIS Date Route   Pfizer COVID-19 Vaccine 11/01/2019  9:56 AM 0.3 mL 07/13/2019 Intramuscular   Manufacturer: [employer], [employer]   Lot: [id number]   NDC: [address]-2

## 2019-11-06 ENCOUNTER — Encounter: Payer: Self-pay | Admitting: Neurology

## 2019-11-06 ENCOUNTER — Other Ambulatory Visit: Payer: Self-pay

## 2019-11-06 ENCOUNTER — Ambulatory Visit (INDEPENDENT_AMBULATORY_CARE_PROVIDER_SITE_OTHER): Payer: Managed Care, Other (non HMO) | Admitting: Neurology

## 2019-11-06 VITALS — BP 113/73 | HR 70 | Temp 97.8°F | Ht 66.0 in | Wt 161.2 lb

## 2019-11-06 DIAGNOSIS — G40309 Generalized idiopathic epilepsy and epileptic syndromes, not intractable, without status epilepticus: Secondary | ICD-10-CM | POA: Diagnosis not present

## 2019-11-06 MED ORDER — LAMOTRIGINE 25 MG PO TABS: ORAL_TABLET | ORAL | 0 refills | Status: DC

## 2019-11-06 NOTE — Patient Instructions (Addendum)
Start Lamictal titration working up to 100 mg twice daily   1 tablet twice a day for the first week 2 tablets twice a day for the second week 3 tablets twice a day for the third week (decrease Depakote ER 500 mg at bedtime) 4 tablets twice a day for the fourth week (stay on Depakote ER 500 mg at bedtime)  For total of 140 tablets  After finish titration with small dose of lamotrigine 25 mg, change to lamotrigine 100 mg twice a day   Once increase to Lamictal 75 mg twice daily, decrease dose of Depakote ER to 500 mg at bedtime, stay on both medications for 3 weeks, then stop Depakote  See you back in 3 months to see how doing
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
## 2019-11-21 ENCOUNTER — Encounter: Payer: Self-pay | Admitting: Neurology

## 2019-11-21 ENCOUNTER — Telehealth: Payer: Self-pay | Admitting: Neurology

## 2019-11-21 NOTE — Telephone Encounter (Signed)
I called the patient in response to her MyChart message.  She had side effect when increasing Lamictal, weaning Depakote.  She stopped the Lamictal, is now just taking Depakote ER 500 mg, 2 tablets daily.  She did the right thing stopping Lamictal.  For now, she will remain on Depakote at current dose.  In the future, if she wants to switch we may consider Vimpat.  She is on Prozac for depression and anxiety, she says is under good control.

## 2019-11-26 ENCOUNTER — Ambulatory Visit: Payer: Managed Care, Other (non HMO) | Attending: Internal Medicine

## 2019-11-26 DIAGNOSIS — Z23 Encounter for immunization: Secondary | ICD-10-CM

## 2019-11-26 NOTE — Progress Notes (Signed)
   Covid-19 Vaccination Clinic  Name:  Rita Watson    MRN: [id number] DOB: 1956-04-18  11/26/2019  Rita Watson was observed post Covid-19 immunization for 15 minutes without incident. She was provided with Vaccine Information Sheet and instruction to access the V-Safe system.   Rita Watson was instructed to call 911 with any severe reactions post vaccine: Marland Kitchen Difficulty breathing  . Swelling of face and throat  . A fast heartbeat  . A bad rash all over body  . Dizziness and weakness   Immunizations Administered    Name Date Dose VIS Date Route   Pfizer COVID-19 Vaccine 11/26/2019  8:59 AM 0.3 mL 09/26/2018 Intramuscular   Manufacturer: [employer], [employer]   Lot: [id number]   NDC: [address]-9

## 2019-12-11 NOTE — Progress Notes (Signed)
I have reviewed and agreed above plan. 

## 2019-12-17 IMAGING — RF DG HIP (WITH PELVIS) OPERATIVE*R*
1 series · 2 of 2 positions shown · non-contrast
Comparison: None.

CLINICAL DATA: Intraoperative imaging for patient undergoing right
hip replacement.

EXAM:
OPERATIVE RIGHT HIP (WITH PELVIS IF PERFORMED) 2 VIEWS
TECHNIQUE: Fluoroscopic spot image(s) were submitted for interpretation
post-operatively.

[Series 1: unknown protocol · 0.20mm/px · 2 of 2 slices shown]
[im 1/2]
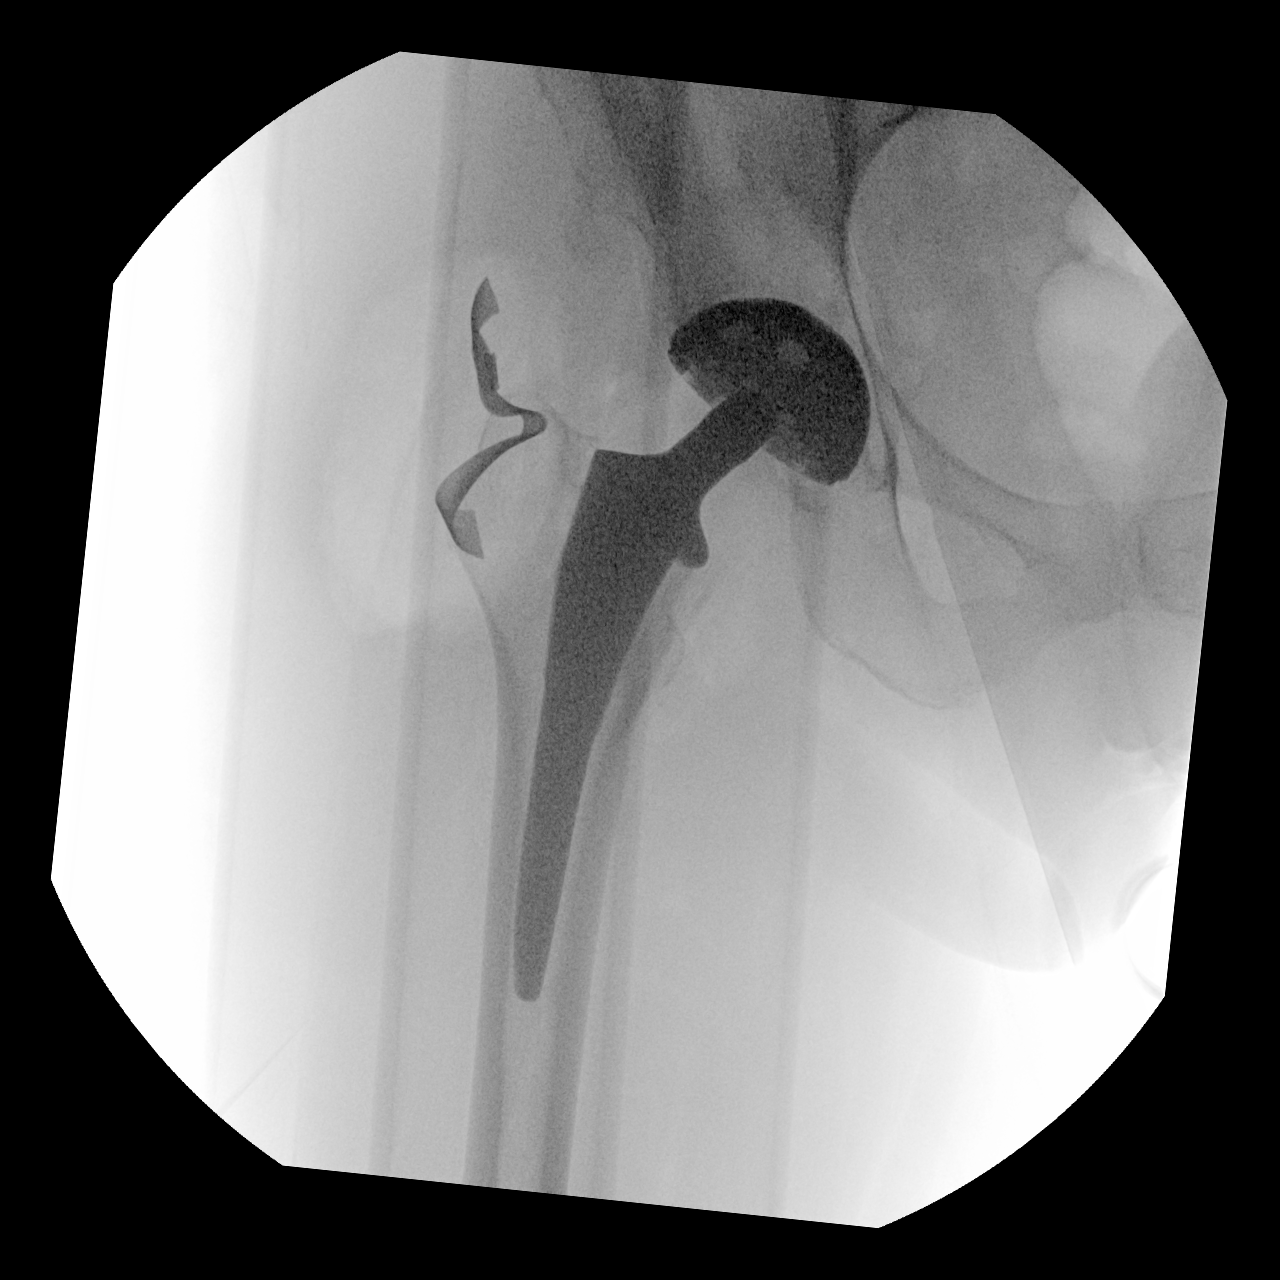
[im 2/2]
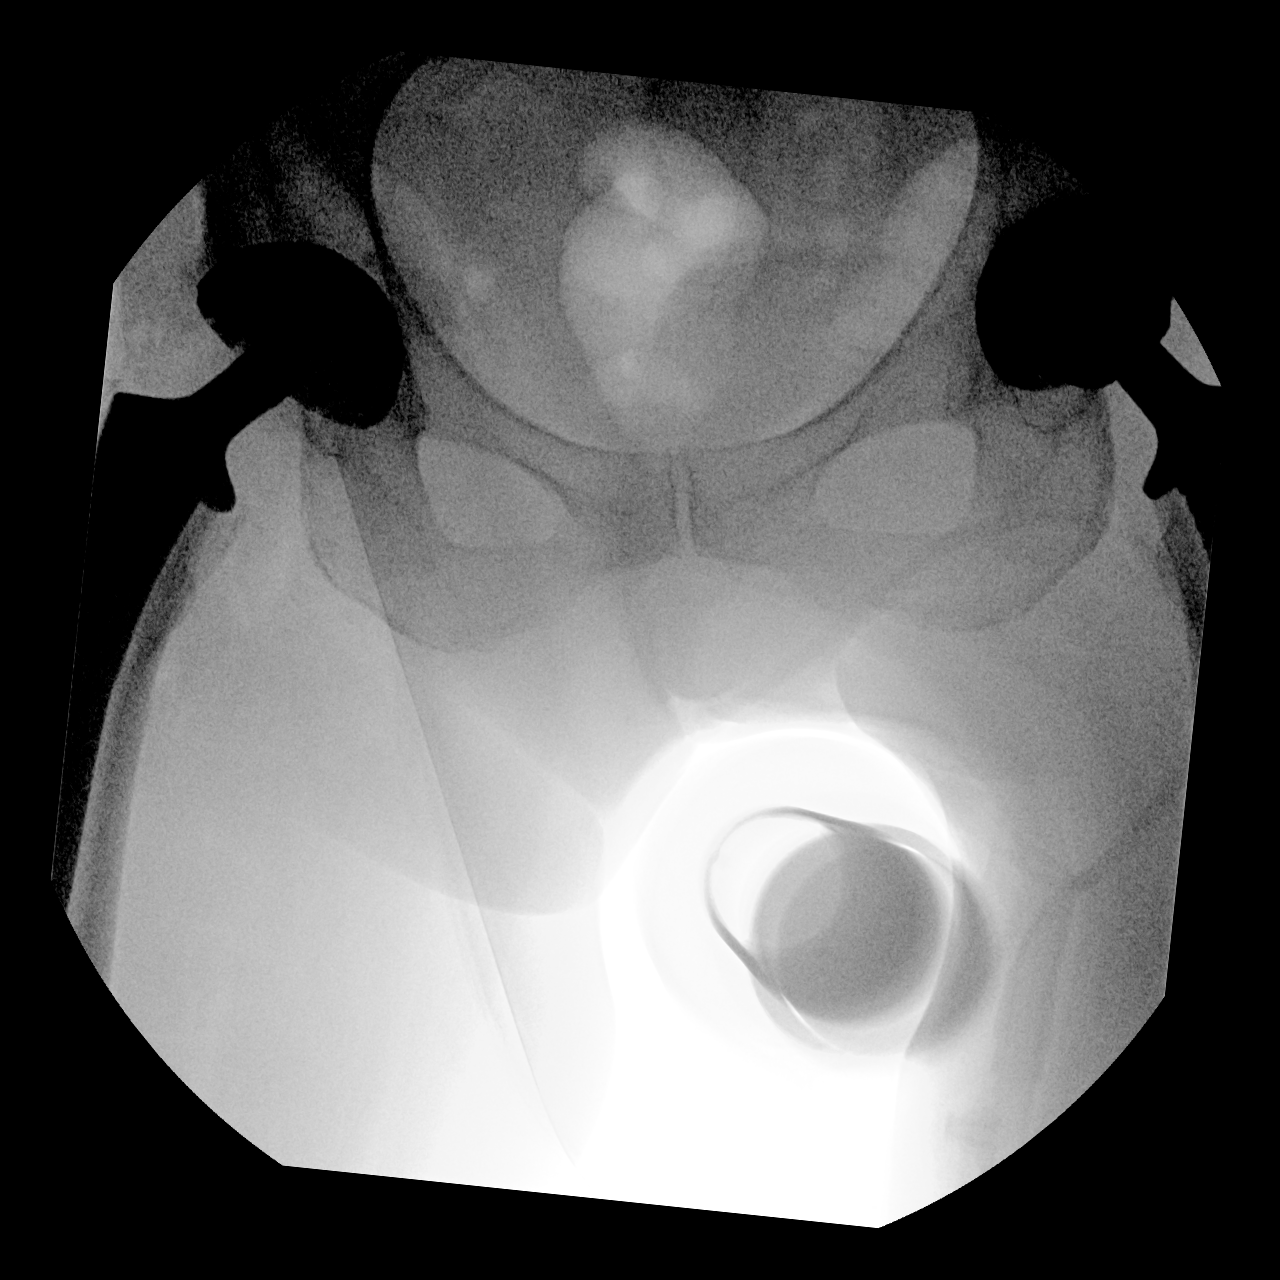

[2 of 2 positions shown; findings below may reference images not displayed]

FINDINGS: Single view of the right hip and low pelvis are provided. Images
demonstrate an acetabular cup in place in the right hip. Femoral
stem is also identified. Femoral head is not yet in place.
IMPRESSION: Intraoperative imaging for right hip replacement in progress.

## 2019-12-17 IMAGING — DX DG PORTABLE PELVIS
1 series · 1 of 1 positions shown · non-contrast
Comparison: None.

CLINICAL DATA: Right total hip arthroplasty.

EXAM:
PORTABLE PELVIS 1-2 VIEWS

[pelvis ap]
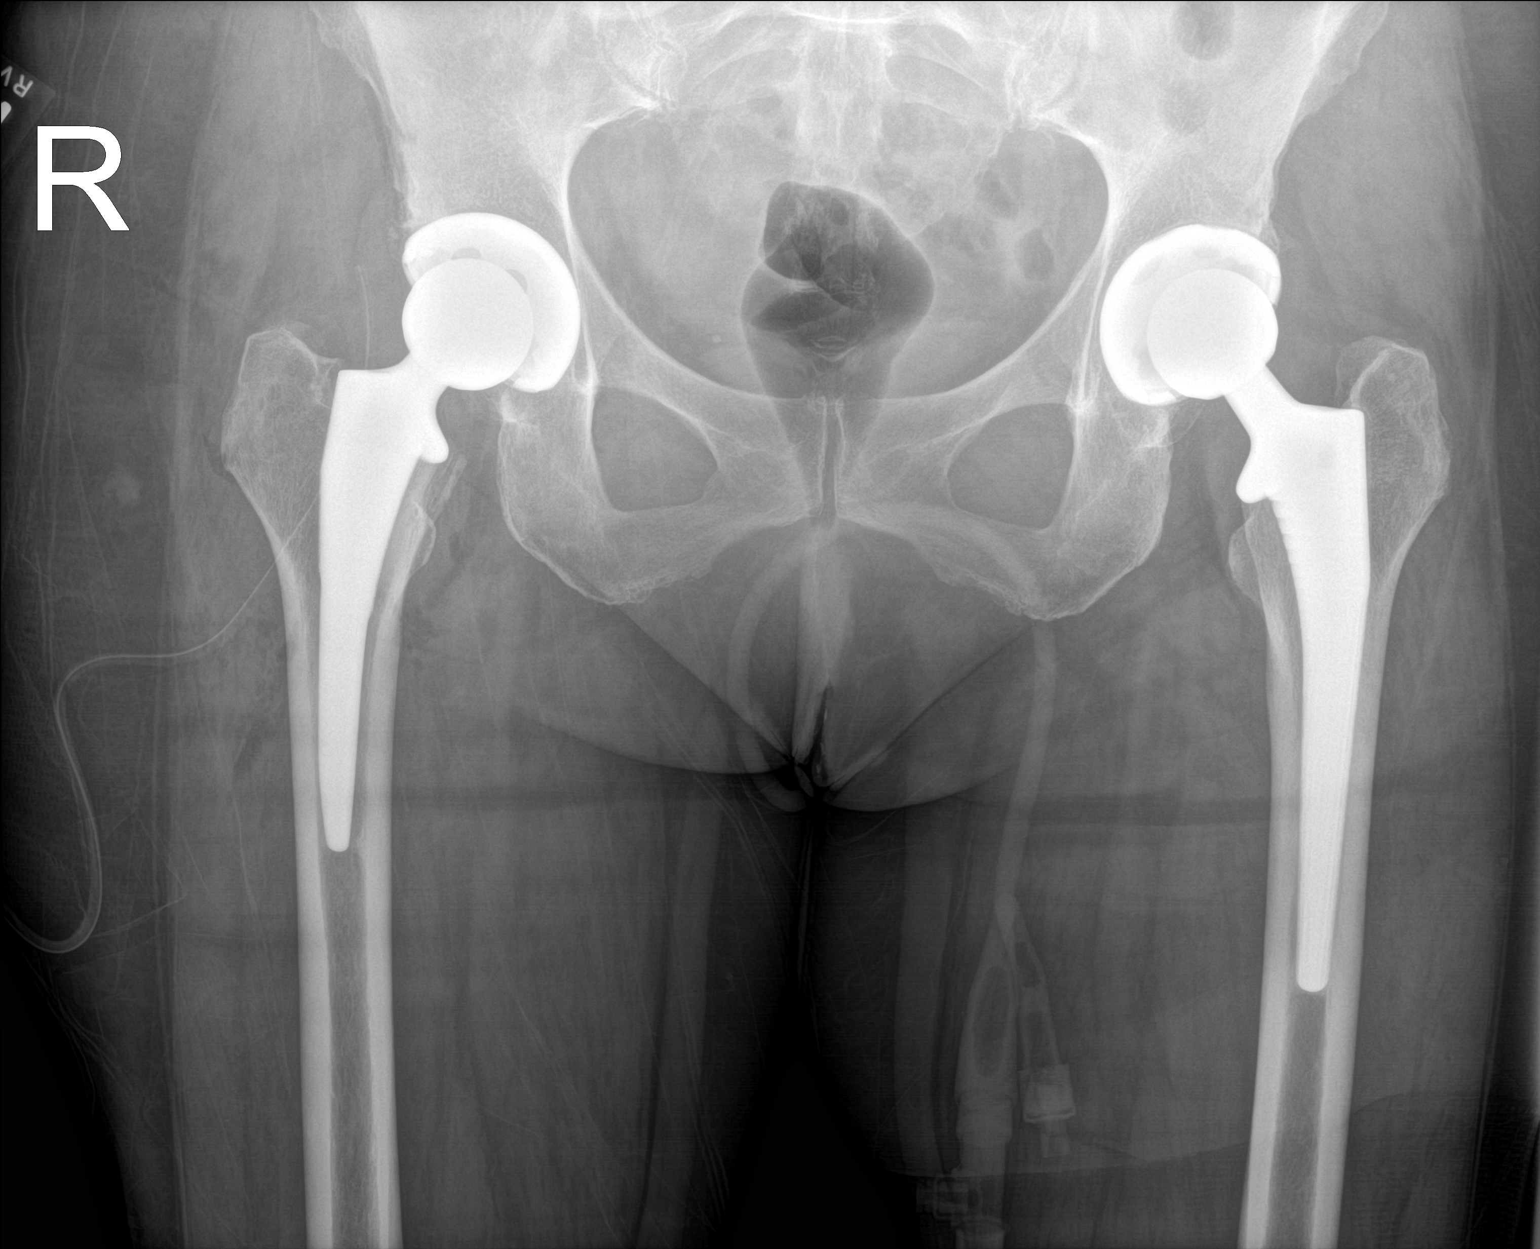

[1 of 1 positions shown; findings below may reference images not displayed]

FINDINGS: Interval right total hip arthroplasty without failure or
complication. No fracture or dislocation. Surgical drain in place.
Postsurgical changes in the surrounding soft tissues.

Prior left hip arthroplasty again noted.
IMPRESSION: Interval right total hip arthroplasty.

## 2020-01-16 ENCOUNTER — Ambulatory Visit: Payer: Managed Care, Other (non HMO) | Admitting: Neurology

## 2020-02-05 ENCOUNTER — Ambulatory Visit: Payer: Managed Care, Other (non HMO) | Admitting: Neurology

## 2020-05-24 ENCOUNTER — Other Ambulatory Visit: Payer: Self-pay | Admitting: Neurology

## 2020-05-27 ENCOUNTER — Encounter: Payer: Self-pay | Admitting: Neurology

## 2020-05-27 ENCOUNTER — Telehealth: Payer: Self-pay | Admitting: Neurology

## 2020-05-27 MED ORDER — DIVALPROEX SODIUM ER 500 MG PO TB24: 1000.0000 mg | ORAL_TABLET | Freq: Every day | ORAL | 0 refills | Status: DC

## 2020-05-27 NOTE — Telephone Encounter (Signed)
As I understand, patient needed a separate prescription for her divalproex to be sent to [facility].  I received a message from the call service as well as a follow-up phone call as the patient was at the pharmacy and was not able to pick up her Depakote prescription.  I placed a prescription for Depakote ER 500 mg strength 2 pills daily with 1 month supply and no refills to be sent to [facility] pharmacy as per her request.

## 2020-05-28 NOTE — Telephone Encounter (Signed)
I called pt and LMVM for her to make sure she had her divalproex,  Sorry you had to call on call MD to get the medication, glad she did get for you.  Again I apologize.

## 2020-08-12 ENCOUNTER — Encounter: Payer: Self-pay | Admitting: Neurology

## 2020-08-14 ENCOUNTER — Telehealth (INDEPENDENT_AMBULATORY_CARE_PROVIDER_SITE_OTHER): Payer: 59 | Admitting: Neurology

## 2020-08-14 ENCOUNTER — Encounter: Payer: Self-pay | Admitting: Neurology

## 2020-08-14 DIAGNOSIS — G40309 Generalized idiopathic epilepsy and epileptic syndromes, not intractable, without status epilepticus: Secondary | ICD-10-CM

## 2020-08-14 MED ORDER — DIVALPROEX SODIUM ER 500 MG PO TB24: 1000.0000 mg | ORAL_TABLET | Freq: Every day | ORAL | 3 refills | Status: DC
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
## 2020-08-20 ENCOUNTER — Telehealth: Payer: Self-pay | Admitting: Neurology

## 2020-08-20 MED ORDER — VIMPAT 100 MG PO TABS: ORAL_TABLET | ORAL | 5 refills | Status: DC

## 2020-08-20 NOTE — Addendum Note (Signed)
Addended by: Glean Salvo on: 08/20/2020 04:14 PM   Modules accepted: Orders

## 2020-08-20 NOTE — Telephone Encounter (Signed)
I have been trying to call the patient twice today and left 2 messages. Please try to reach her to discuss switching to Vimpat. I already sent in Vimpat.  Addendum 08/20/2020 SS: I talked with Dr. Terrace Arabia, will switch patient from Depakote to Vimpat.  We will start Vimpat 50 mg twice a day for 1 week, then increase to 100 mg twice a day stay on for 1 week, then decrease Depakote ER to 500 mg daily for 1 week, then stop, continue on Vimpat 100 mg twice a day. It is important to pay attention to any warning signs of seizures when switching medications, be very careful with driving during the switch. Last seizure was July 2020.

## 2020-08-21 NOTE — Telephone Encounter (Signed)
Sent message to pt

## 2020-08-22 ENCOUNTER — Telehealth: Payer: Self-pay | Admitting: *Deleted

## 2020-08-22 NOTE — Telephone Encounter (Signed)
PA for Vimpat 100mg  started on covermymeds (key ). Pt has pharmacy coverage through [employer] [phone]). (WS#[id number]. Decision pending. Marked for expedited review.

## 2020-08-22 NOTE — Telephone Encounter (Signed)
Approved through 08/22/2021

## 2020-08-25 ENCOUNTER — Encounter: Payer: Self-pay | Admitting: Neurology

## 2020-08-25 NOTE — Telephone Encounter (Signed)
Spoke to [employer] copay assi max $1300/yr for copay assistance.  She is not sure what her deductible for her [facility]  is but will have another in April 1st with [employer] .  She would like to stay where she is right now if ok to do so and then see what comes from [facility] in April.  Please advise.

## 2020-08-27 ENCOUNTER — Telehealth: Payer: Self-pay | Admitting: Neurology

## 2020-08-27 NOTE — Telephone Encounter (Signed)
I received blood work, collected 08/20/2020, Hgb mildly low 11.4, creatinine 0.58, normal liver function AST 20, ALT 12, TSH normal 2.69, vitamin D 52.5.

## 2020-09-04 NOTE — Progress Notes (Signed)
I have reviewed and agreed above plan. 

## 2020-11-11 DIAGNOSIS — Z124 Encounter for screening for malignant neoplasm of cervix: Secondary | ICD-10-CM | POA: Diagnosis not present

## 2020-11-11 DIAGNOSIS — Z6827 Body mass index (BMI) 27.0-27.9, adult: Secondary | ICD-10-CM | POA: Diagnosis not present

## 2020-11-11 DIAGNOSIS — Z01419 Encounter for gynecological examination (general) (routine) without abnormal findings: Secondary | ICD-10-CM | POA: Diagnosis not present

## 2020-12-16 ENCOUNTER — Other Ambulatory Visit: Payer: Self-pay | Admitting: Neurology

## 2021-02-10 ENCOUNTER — Encounter: Payer: Self-pay | Admitting: Neurology

## 2021-02-10 ENCOUNTER — Other Ambulatory Visit: Payer: Self-pay | Admitting: Neurology

## 2021-02-11 MED ORDER — DIVALPROEX SODIUM ER 500 MG PO TB24: 1000.0000 mg | ORAL_TABLET | Freq: Every day | ORAL | 1 refills | Status: DC

## 2021-02-11 NOTE — Telephone Encounter (Signed)
I called the patient and scheduled a follow up for her. First available w/ Sarah on 07/14/21. She would like to continue her current medication. Refills have been sent to last her to this appt.

## 2021-07-14 ENCOUNTER — Encounter: Payer: Self-pay | Admitting: Neurology

## 2021-07-14 ENCOUNTER — Ambulatory Visit: Payer: BC Managed Care – PPO | Admitting: Neurology

## 2021-07-14 ENCOUNTER — Ambulatory Visit: Payer: Self-pay | Admitting: Neurology

## 2021-07-14 VITALS — BP 113/74 | HR 70 | Ht 66.0 in | Wt 163.0 lb

## 2021-07-14 DIAGNOSIS — G40309 Generalized idiopathic epilepsy and epileptic syndromes, not intractable, without status epilepticus: Secondary | ICD-10-CM

## 2021-07-14 MED ORDER — LACOSAMIDE 100 MG PO TABS: ORAL_TABLET | ORAL | 5 refills | Status: DC

## 2021-07-14 NOTE — Patient Instructions (Addendum)
We will start Vimpat 50 mg twice a day for 1 week, then increase to 100 mg twice a day stay on for 1 week, then decrease Depakote ER to 500 mg daily for 1 week, then stop, continue on Vimpat 100 mg twice a day  Use [employer] to get coupon for Vimpat   See you back in 6 months
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
## 2021-07-14 NOTE — Progress Notes (Signed)
Chart reviewed, agree above plan ?

## 2021-08-21 DIAGNOSIS — Z0000 Encounter for general adult medical examination without abnormal findings: Secondary | ICD-10-CM | POA: Diagnosis not present

## 2021-08-24 DIAGNOSIS — D509 Iron deficiency anemia, unspecified: Secondary | ICD-10-CM | POA: Diagnosis not present

## 2021-08-24 DIAGNOSIS — Z1159 Encounter for screening for other viral diseases: Secondary | ICD-10-CM | POA: Diagnosis not present

## 2021-08-24 DIAGNOSIS — Z79899 Other long term (current) drug therapy: Secondary | ICD-10-CM | POA: Diagnosis not present

## 2021-08-24 DIAGNOSIS — Z0000 Encounter for general adult medical examination without abnormal findings: Secondary | ICD-10-CM | POA: Diagnosis not present

## 2021-10-29 DIAGNOSIS — Z1231 Encounter for screening mammogram for malignant neoplasm of breast: Secondary | ICD-10-CM | POA: Diagnosis not present

## 2021-12-09 DIAGNOSIS — R1013 Epigastric pain: Secondary | ICD-10-CM | POA: Diagnosis not present

## 2021-12-29 DIAGNOSIS — R634 Abnormal weight loss: Secondary | ICD-10-CM | POA: Diagnosis not present

## 2021-12-29 DIAGNOSIS — K219 Gastro-esophageal reflux disease without esophagitis: Secondary | ICD-10-CM | POA: Diagnosis not present

## 2021-12-29 DIAGNOSIS — R1013 Epigastric pain: Secondary | ICD-10-CM | POA: Diagnosis not present

## 2021-12-29 DIAGNOSIS — R194 Change in bowel habit: Secondary | ICD-10-CM | POA: Diagnosis not present

## 2022-01-04 DIAGNOSIS — R634 Abnormal weight loss: Secondary | ICD-10-CM | POA: Diagnosis not present

## 2022-01-04 DIAGNOSIS — K219 Gastro-esophageal reflux disease without esophagitis: Secondary | ICD-10-CM | POA: Diagnosis not present

## 2022-01-04 DIAGNOSIS — K317 Polyp of stomach and duodenum: Secondary | ICD-10-CM | POA: Diagnosis not present

## 2022-01-04 DIAGNOSIS — K2970 Gastritis, unspecified, without bleeding: Secondary | ICD-10-CM | POA: Diagnosis not present

## 2022-01-04 DIAGNOSIS — K319 Disease of stomach and duodenum, unspecified: Secondary | ICD-10-CM | POA: Diagnosis not present

## 2022-01-04 DIAGNOSIS — R1013 Epigastric pain: Secondary | ICD-10-CM | POA: Diagnosis not present

## 2022-01-14 ENCOUNTER — Ambulatory Visit: Payer: BC Managed Care – PPO | Admitting: Neurology

## 2022-01-14 VITALS — BP 115/71 | HR 69 | Ht 66.0 in | Wt 160.0 lb

## 2022-01-14 DIAGNOSIS — G40309 Generalized idiopathic epilepsy and epileptic syndromes, not intractable, without status epilepticus: Secondary | ICD-10-CM

## 2022-01-14 MED ORDER — LACOSAMIDE 100 MG PO TABS: ORAL_TABLET | ORAL | 5 refills | Status: DC
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
## 2022-02-09 DIAGNOSIS — R059 Cough, unspecified: Secondary | ICD-10-CM | POA: Diagnosis not present

## 2022-02-09 DIAGNOSIS — K219 Gastro-esophageal reflux disease without esophagitis: Secondary | ICD-10-CM | POA: Diagnosis not present

## 2022-02-09 DIAGNOSIS — R110 Nausea: Secondary | ICD-10-CM | POA: Diagnosis not present
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
## 2022-08-04 ENCOUNTER — Ambulatory Visit: Payer: BC Managed Care – PPO | Admitting: Neurology

## 2022-08-04 ENCOUNTER — Encounter: Payer: Self-pay | Admitting: Neurology

## 2022-08-04 ENCOUNTER — Other Ambulatory Visit: Payer: Self-pay | Admitting: Neurology

## 2022-08-04 VITALS — BP 132/79 | HR 89 | Ht 66.0 in | Wt 145.0 lb

## 2022-08-04 DIAGNOSIS — G40309 Generalized idiopathic epilepsy and epileptic syndromes, not intractable, without status epilepticus: Secondary | ICD-10-CM | POA: Diagnosis not present

## 2022-08-04 MED ORDER — LACOSAMIDE 100 MG PO TABS: ORAL_TABLET | ORAL | 5 refills | Status: DC

## 2022-08-20 DIAGNOSIS — Z79899 Other long term (current) drug therapy: Secondary | ICD-10-CM | POA: Diagnosis not present

## 2022-08-20 DIAGNOSIS — D509 Iron deficiency anemia, unspecified: Secondary | ICD-10-CM | POA: Diagnosis not present

## 2022-08-20 DIAGNOSIS — Z13220 Encounter for screening for lipoid disorders: Secondary | ICD-10-CM | POA: Diagnosis not present

## 2022-08-20 DIAGNOSIS — D649 Anemia, unspecified: Secondary | ICD-10-CM | POA: Diagnosis not present

## 2022-08-23 DIAGNOSIS — Z23 Encounter for immunization: Secondary | ICD-10-CM | POA: Diagnosis not present

## 2022-08-23 DIAGNOSIS — Z0000 Encounter for general adult medical examination without abnormal findings: Secondary | ICD-10-CM | POA: Diagnosis not present

## 2022-10-07 ENCOUNTER — Encounter: Payer: Self-pay | Admitting: Physical Therapy

## 2022-10-07 ENCOUNTER — Ambulatory Visit: Payer: BC Managed Care – PPO | Attending: Family Medicine | Admitting: Physical Therapy

## 2022-10-07 ENCOUNTER — Other Ambulatory Visit: Payer: Self-pay

## 2022-10-07 DIAGNOSIS — N393 Stress incontinence (female) (male): Secondary | ICD-10-CM | POA: Diagnosis not present

## 2022-10-07 DIAGNOSIS — R293 Abnormal posture: Secondary | ICD-10-CM | POA: Insufficient documentation

## 2022-10-07 DIAGNOSIS — M6281 Muscle weakness (generalized): Secondary | ICD-10-CM

## 2022-10-07 DIAGNOSIS — M62838 Other muscle spasm: Secondary | ICD-10-CM | POA: Diagnosis not present
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
## 2022-11-04 DIAGNOSIS — M8589 Other specified disorders of bone density and structure, multiple sites: Secondary | ICD-10-CM | POA: Diagnosis not present

## 2022-11-04 DIAGNOSIS — Z1231 Encounter for screening mammogram for malignant neoplasm of breast: Secondary | ICD-10-CM | POA: Diagnosis not present

## 2022-11-04 DIAGNOSIS — Z780 Asymptomatic menopausal state: Secondary | ICD-10-CM | POA: Diagnosis not present

## 2022-11-23 ENCOUNTER — Ambulatory Visit: Payer: BC Managed Care – PPO | Admitting: Physical Therapy
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
## 2022-12-01 ENCOUNTER — Ambulatory Visit: Payer: BC Managed Care – PPO | Attending: Family Medicine | Admitting: Physical Therapy

## 2022-12-01 DIAGNOSIS — M6281 Muscle weakness (generalized): Secondary | ICD-10-CM | POA: Insufficient documentation

## 2022-12-01 DIAGNOSIS — R293 Abnormal posture: Secondary | ICD-10-CM | POA: Diagnosis not present

## 2022-12-01 DIAGNOSIS — M62838 Other muscle spasm: Secondary | ICD-10-CM | POA: Diagnosis not present

## 2022-12-07 ENCOUNTER — Ambulatory Visit: Payer: BC Managed Care – PPO | Admitting: Physical Therapy

## 2022-12-07 ENCOUNTER — Encounter: Payer: Self-pay | Admitting: Physical Therapy

## 2022-12-07 DIAGNOSIS — M6281 Muscle weakness (generalized): Secondary | ICD-10-CM

## 2022-12-07 DIAGNOSIS — M62838 Other muscle spasm: Secondary | ICD-10-CM

## 2022-12-07 DIAGNOSIS — R293 Abnormal posture: Secondary | ICD-10-CM
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
## 2022-12-14 ENCOUNTER — Encounter: Payer: Self-pay | Admitting: Physical Therapy

## 2022-12-14 ENCOUNTER — Ambulatory Visit: Payer: BC Managed Care – PPO | Admitting: Physical Therapy

## 2022-12-14 DIAGNOSIS — M6281 Muscle weakness (generalized): Secondary | ICD-10-CM | POA: Diagnosis not present

## 2022-12-14 DIAGNOSIS — R293 Abnormal posture: Secondary | ICD-10-CM

## 2022-12-14 DIAGNOSIS — M62838 Other muscle spasm: Secondary | ICD-10-CM | POA: Diagnosis not present
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
## 2022-12-17 DIAGNOSIS — K136 Irritative hyperplasia of oral mucosa: Secondary | ICD-10-CM | POA: Diagnosis not present

## 2022-12-21 ENCOUNTER — Ambulatory Visit: Payer: BC Managed Care – PPO | Admitting: Physical Therapy

## 2022-12-21 DIAGNOSIS — M6281 Muscle weakness (generalized): Secondary | ICD-10-CM

## 2022-12-21 DIAGNOSIS — M62838 Other muscle spasm: Secondary | ICD-10-CM | POA: Diagnosis not present

## 2022-12-21 DIAGNOSIS — R293 Abnormal posture: Secondary | ICD-10-CM
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
## 2022-12-28 ENCOUNTER — Ambulatory Visit: Payer: BC Managed Care – PPO | Admitting: Physical Therapy

## 2022-12-28 DIAGNOSIS — R293 Abnormal posture: Secondary | ICD-10-CM

## 2022-12-28 DIAGNOSIS — M62838 Other muscle spasm: Secondary | ICD-10-CM | POA: Diagnosis not present

## 2022-12-28 DIAGNOSIS — M6281 Muscle weakness (generalized): Secondary | ICD-10-CM | POA: Diagnosis not present
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
## 2023-01-04 ENCOUNTER — Ambulatory Visit: Payer: BC Managed Care – PPO | Attending: Family Medicine | Admitting: Physical Therapy

## 2023-01-04 DIAGNOSIS — M6281 Muscle weakness (generalized): Secondary | ICD-10-CM | POA: Insufficient documentation

## 2023-01-04 DIAGNOSIS — R293 Abnormal posture: Secondary | ICD-10-CM | POA: Diagnosis not present

## 2023-01-04 DIAGNOSIS — M62838 Other muscle spasm: Secondary | ICD-10-CM | POA: Diagnosis not present
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
## 2023-01-20 ENCOUNTER — Ambulatory Visit: Payer: BC Managed Care – PPO | Admitting: Neurology

## 2023-02-04 ENCOUNTER — Other Ambulatory Visit: Payer: Self-pay | Admitting: Neurology

## 2023-02-04 ENCOUNTER — Telehealth: Payer: Self-pay | Admitting: Neurology

## 2023-02-04 MED ORDER — LACOSAMIDE 100 MG PO TABS
ORAL_TABLET | ORAL | 5 refills | Status: DC
Start: 2023-02-04 — End: 2023-02-04

## 2023-02-04 MED ORDER — LACOSAMIDE 100 MG PO TABS
ORAL_TABLET | ORAL | 5 refills | Status: DC
Start: 2023-02-04 — End: 2023-03-08

## 2023-02-04 NOTE — Telephone Encounter (Signed)
I received telephone call from [facility] answering service with regards to this patient requesting medication refill for lacosamide.  I sent an electronic refill to [facility] pharmacy

## 2023-03-08 ENCOUNTER — Other Ambulatory Visit: Payer: Self-pay | Admitting: Neurology

## 2023-03-08 ENCOUNTER — Encounter: Payer: Self-pay | Admitting: Neurology

## 2023-03-08 MED ORDER — LACOSAMIDE 100 MG PO TABS
ORAL_TABLET | ORAL | 5 refills | Status: DC
Start: 2023-03-08 — End: 2023-03-08

## 2023-03-08 MED ORDER — LACOSAMIDE 100 MG PO TABS: ORAL_TABLET | ORAL | 5 refills | Status: DC

## 2023-03-08 NOTE — Telephone Encounter (Signed)
Requested Prescriptions   Pending Prescriptions Disp Refills   Lacosamide (VIMPAT) 100 MG TABS 60 tablet 5    Sig: Take 1 tablet twice daily   Refused Prescriptions Disp Refills   Lacosamide 100 MG TABS [Pharmacy Med Name: Lacosamide 100 MG Oral Tablet] 60 tablet 0    Sig: Take 1 tablet by mouth twice daily    Refused By: Berneice Gandy A    Reason for Refusal: Patient has requested refill too soon   Last seen 08/04/22 next appt 08/10/23 Dispenses  Due for refill Dispensed Days Supply Quantity Provider Pharmacy  LACOSAMIDE 100MG     TAB 02/05/2023 30 60 each Micki Riley, MD [facility] [phone] ...  LACOSAMIDE 100MG     TAB 01/06/2023 30 60 each Glean Salvo, NP [facility] [phone] ...  LACOSAMIDE 100MG     TAB 12/04/2022 30 60 each Glean Salvo, NP [facility] [phone] ...  LACOSAMIDE 100MG     TAB 11/03/2022 30 60 each Glean Salvo, NP [facility] [phone] ...  LACOSAMIDE 100MG     TAB 10/02/2022 30 60 each Glean Salvo, NP [facility] [phone] ...  LACOSAMIDE 100MG     TAB 09/02/2022 30 60 each Glean Salvo, NP [facility] [phone] ...  LACOSAMIDE 100MG     TAB 08/04/2022 30 60 each Glean Salvo, NP [facility] [phone] ...  LACOSAMIDE 100MG     TAB 07/04/2022 30 60 each Glean Salvo, NP [facility] [phone] ...  LACOSAMIDE 100MG     TAB 06/02/2022 30 60 each Glean Salvo, NP [facility] [phone] ...  LACOSAMIDE 100MG     TAB 05/05/2022 30 60 each Glean Salvo, NP [facility] [phone] ...  LACOSAMIDE 100MG     TAB 04/03/2022 30 60 each Glean Salvo, NP [facility] [phone] .Marland KitchenMarland Kitchen
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
## 2023-08-10 ENCOUNTER — Ambulatory Visit: Payer: BC Managed Care – PPO | Admitting: Neurology

## 2023-08-10 ENCOUNTER — Encounter: Payer: Self-pay | Admitting: Neurology

## 2023-08-10 VITALS — BP 123/81 | HR 89 | Ht 66.0 in | Wt 143.0 lb

## 2023-08-10 DIAGNOSIS — G40309 Generalized idiopathic epilepsy and epileptic syndromes, not intractable, without status epilepticus: Secondary | ICD-10-CM

## 2023-08-10 NOTE — Patient Instructions (Addendum)
 Great to see you today!!  We will continue Vimpat for seizure prevention.  Please let me know in a month or so when you need a refill.  Call for any seizure activity.  I will see you back in 1 year.  Thanks!!

## 2023-08-31 DIAGNOSIS — Z13220 Encounter for screening for lipoid disorders: Secondary | ICD-10-CM | POA: Diagnosis not present

## 2023-08-31 DIAGNOSIS — Z0000 Encounter for general adult medical examination without abnormal findings: Secondary | ICD-10-CM | POA: Diagnosis not present

## 2023-08-31 DIAGNOSIS — E559 Vitamin D deficiency, unspecified: Secondary | ICD-10-CM | POA: Diagnosis not present

## 2023-08-31 DIAGNOSIS — Z79899 Other long term (current) drug therapy: Secondary | ICD-10-CM | POA: Diagnosis not present

## 2023-09-13 ENCOUNTER — Encounter: Payer: Self-pay | Admitting: Neurology

## 2023-09-13 ENCOUNTER — Other Ambulatory Visit: Payer: Self-pay

## 2023-09-13 MED ORDER — LACOSAMIDE 100 MG PO TABS: ORAL_TABLET | ORAL | 5 refills | Status: DC

## 2023-09-13 NOTE — Telephone Encounter (Signed)
Last seen 08/10/23, next appt 08/09/24 Dispenses   Dispensed Days Supply Quantity Provider Pharmacy  LACOSAMIDE 100 MG TABLET 08/16/2023 30 60 tablet Windell Norfolk, MD [id number] ...  LACOSAMIDE 100 MG TABLET 07/14/2023 30 60 tablet Windell Norfolk, MD [facility] ...  LACOSAMIDE 100 MG TABLET 06/13/2023 30 60 tablet Windell Norfolk, MD [facility] ...  LACOSAMIDE 100 MG TABLET 05/16/2023 30 60 tablet Windell Norfolk, MD [facility] ...  LACOSAMIDE 100 MG TABLET 04/11/2023 30 60 tablet Windell Norfolk, MD [facility] ...  LACOSAMIDE 100 MG TABLET 03/08/2023 30 60 tablet Windell Norfolk, MD [facility] ...  LACOSAMIDE 100MG     TAB 02/05/2023 30 60 each Micki Riley, MD [facility] [phone] ...  LACOSAMIDE 100MG     TAB 01/06/2023 30 60 each Glean Salvo, NP [facility] [phone] ...  LACOSAMIDE 100MG     TAB 12/04/2022 30 60 each Glean Salvo, NP [facility] [phone] ...  LACOSAMIDE 100MG     TAB 11/03/2022 30 60 each Glean Salvo, NP [facility] [phone] ...  LACOSAMIDE 100MG     TAB 10/02/2022 30 60 each Glean Salvo, NP [facility] [phone] .Marland KitchenMarland Kitchen

## 2023-11-23 DIAGNOSIS — Z1231 Encounter for screening mammogram for malignant neoplasm of breast: Secondary | ICD-10-CM | POA: Diagnosis not present

## 2024-03-26 ENCOUNTER — Other Ambulatory Visit: Payer: Self-pay | Admitting: Neurology

## 2024-03-26 ENCOUNTER — Encounter: Payer: Self-pay | Admitting: Neurology

## 2024-03-26 MED ORDER — LACOSAMIDE 100 MG PO TABS: ORAL_TABLET | ORAL | 5 refills | Status: AC

## 2024-03-26 NOTE — Telephone Encounter (Signed)
 Requested Prescriptions   Pending Prescriptions Disp Refills   Lacosamide  (VIMPAT ) 100 MG TABS 60 tablet 5    Sig: Take 1 tablet twice daily   Last seen 08/10/23 Next appt 08/09/24  Dispenses   Dispensed Days Supply Quantity Provider Pharmacy  LACOSAMIDE  100 MG TABLET 02/20/2024 30 60 tablet Gregg Lek, MD [id number] ...  LACOSAMIDE  100 MG TABLET 01/19/2024 30 60 tablet Camara, Amadou, MD [facility] ...  LACOSAMIDE  100 MG TABLET 12/19/2023 30 60 tablet Camara, Amadou, MD [facility] ...  LACOSAMIDE  100 MG TABLET 11/14/2023 30 60 tablet Gregg Lek, MD [facility] ...  LACOSAMIDE  100 MG TABLET 10/15/2023 30 60 tablet Camara, Amadou, MD [facility] ...  LACOSAMIDE  100 MG TABLET 09/13/2023 30 60 tablet Gregg Lek, MD [facility] ...  LACOSAMIDE  100 MG TABLET 08/16/2023 30 60 tablet Gregg Lek, MD [facility] ...  LACOSAMIDE  100 MG TABLET 07/14/2023 30 60 tablet Camara, Amadou, MD [facility] ...  LACOSAMIDE  100 MG TABLET 06/13/2023 30 60 tablet Gregg Lek, MD [facility] ...  LACOSAMIDE  100 MG TABLET 05/16/2023 30 60 tablet Camara, Amadou, MD [facility] ...  LACOSAMIDE  100 MG TABLET 04/11/2023 30 60 tablet Camara, Amadou, MD [facility] .SABRASABRA

## 2024-03-26 NOTE — Addendum Note (Signed)
 Addended by: ONEITA NEVELYN BRAVO on: 03/26/2024 11:42 AM   Modules accepted: Orders

## 2024-08-09 ENCOUNTER — Telehealth: Payer: Self-pay | Admitting: Neurology

## 2024-08-09 ENCOUNTER — Encounter: Payer: Self-pay | Admitting: Neurology

## 2024-08-09 ENCOUNTER — Ambulatory Visit: Payer: BC Managed Care – PPO | Admitting: Neurology

## 2024-08-09 DIAGNOSIS — G40309 Generalized idiopathic epilepsy and epileptic syndromes, not intractable, without status epilepticus: Secondary | ICD-10-CM | POA: Diagnosis not present

## 2024-08-09 NOTE — Patient Instructions (Signed)
 Great to see you today! Continue Vimpat  for seizure prevention Call for seizure activity, follow-up 1 year.  Thanks!!
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
## 2025-08-13 ENCOUNTER — Ambulatory Visit: Admitting: Neurology
